# Patient Record
Sex: Female | Born: 1965 | Race: Black or African American | Hispanic: No | Marital: Married | Smoking: Never smoker
Health system: Southern US, Community
[De-identification: ages and names within clinical notes are randomized; demographics above are authoritative.]

## PROBLEM LIST (undated history)

## (undated) DIAGNOSIS — E785 Hyperlipidemia, unspecified: Secondary | ICD-10-CM

## (undated) DIAGNOSIS — Z5189 Encounter for other specified aftercare: Secondary | ICD-10-CM

## (undated) DIAGNOSIS — J309 Allergic rhinitis, unspecified: Secondary | ICD-10-CM

## (undated) DIAGNOSIS — H1045 Other chronic allergic conjunctivitis: Secondary | ICD-10-CM

## (undated) DIAGNOSIS — Z9289 Personal history of other medical treatment: Secondary | ICD-10-CM

## (undated) DIAGNOSIS — F329 Major depressive disorder, single episode, unspecified: Secondary | ICD-10-CM

## (undated) DIAGNOSIS — G709 Myoneural disorder, unspecified: Secondary | ICD-10-CM

## (undated) DIAGNOSIS — D649 Anemia, unspecified: Secondary | ICD-10-CM

## (undated) DIAGNOSIS — IMO0001 Reserved for inherently not codable concepts without codable children: Secondary | ICD-10-CM

## (undated) DIAGNOSIS — F32A Depression, unspecified: Secondary | ICD-10-CM

## (undated) HISTORY — DX: Major depressive disorder, single episode, unspecified: F32.9

## (undated) HISTORY — DX: Depression, unspecified: F32.A

## (undated) HISTORY — DX: Hyperlipidemia, unspecified: E78.5

## (undated) HISTORY — PX: BLADDER SURGERY: SHX569

## (undated) HISTORY — DX: Other chronic allergic conjunctivitis: H10.45

## (undated) HISTORY — DX: Allergic rhinitis, unspecified: J30.9

## (undated) HISTORY — DX: Reserved for inherently not codable concepts without codable children: IMO0001

## (undated) HISTORY — DX: Encounter for other specified aftercare: Z51.89

## (undated) HISTORY — DX: Anemia, unspecified: D64.9

---

## 1999-09-07 ENCOUNTER — Encounter: Admission: RE | Admit: 1999-09-07 | Discharge: 1999-09-07 | Payer: Self-pay | Admitting: Family Medicine

## 1999-09-07 ENCOUNTER — Encounter: Payer: Self-pay | Admitting: Family Medicine

## 1999-11-28 ENCOUNTER — Other Ambulatory Visit: Admission: RE | Admit: 1999-11-28 | Discharge: 1999-11-28 | Payer: Self-pay | Admitting: Obstetrics and Gynecology

## 1999-12-07 ENCOUNTER — Encounter: Admission: RE | Admit: 1999-12-07 | Discharge: 1999-12-07 | Payer: Self-pay | Admitting: Obstetrics and Gynecology

## 1999-12-07 ENCOUNTER — Encounter: Payer: Self-pay | Admitting: Obstetrics and Gynecology

## 2000-08-17 ENCOUNTER — Encounter: Payer: Self-pay | Admitting: Obstetrics and Gynecology

## 2000-08-17 ENCOUNTER — Encounter: Admission: RE | Admit: 2000-08-17 | Discharge: 2000-08-17 | Payer: Self-pay | Admitting: Obstetrics and Gynecology

## 2000-11-13 ENCOUNTER — Emergency Department (HOSPITAL_COMMUNITY): Admission: EM | Admit: 2000-11-13 | Discharge: 2000-11-13 | Payer: Self-pay

## 2000-11-13 ENCOUNTER — Encounter: Payer: Self-pay | Admitting: Internal Medicine

## 2001-04-02 ENCOUNTER — Encounter: Payer: Self-pay | Admitting: Family Medicine

## 2001-04-02 ENCOUNTER — Encounter: Admission: RE | Admit: 2001-04-02 | Discharge: 2001-04-02 | Payer: Self-pay | Admitting: Family Medicine

## 2001-04-18 ENCOUNTER — Encounter: Admission: RE | Admit: 2001-04-18 | Discharge: 2001-07-17 | Payer: Self-pay | Admitting: Family Medicine

## 2001-09-24 ENCOUNTER — Ambulatory Visit (HOSPITAL_COMMUNITY): Admission: RE | Admit: 2001-09-24 | Discharge: 2001-09-24 | Payer: Self-pay | Admitting: Family Medicine

## 2001-12-31 ENCOUNTER — Other Ambulatory Visit: Admission: RE | Admit: 2001-12-31 | Discharge: 2001-12-31 | Payer: Self-pay | Admitting: Obstetrics and Gynecology

## 2002-01-02 ENCOUNTER — Encounter: Payer: Self-pay | Admitting: Obstetrics and Gynecology

## 2002-01-02 ENCOUNTER — Encounter: Admission: RE | Admit: 2002-01-02 | Discharge: 2002-01-02 | Payer: Self-pay | Admitting: Obstetrics and Gynecology

## 2002-01-27 ENCOUNTER — Observation Stay (HOSPITAL_COMMUNITY): Admission: RE | Admit: 2002-01-27 | Discharge: 2002-01-28 | Payer: Self-pay | Admitting: Obstetrics and Gynecology

## 2002-01-27 ENCOUNTER — Encounter (INDEPENDENT_AMBULATORY_CARE_PROVIDER_SITE_OTHER): Payer: Self-pay | Admitting: Specialist

## 2004-04-12 ENCOUNTER — Encounter: Admission: RE | Admit: 2004-04-12 | Discharge: 2004-04-12 | Payer: Self-pay | Admitting: Family Medicine

## 2005-02-27 HISTORY — PX: SHOULDER SURGERY: SHX246

## 2005-03-20 ENCOUNTER — Encounter: Admission: RE | Admit: 2005-03-20 | Discharge: 2005-03-20 | Payer: Self-pay | Admitting: Family Medicine

## 2005-04-01 ENCOUNTER — Emergency Department (HOSPITAL_COMMUNITY): Admission: EM | Admit: 2005-04-01 | Discharge: 2005-04-01 | Payer: Self-pay | Admitting: Emergency Medicine

## 2007-05-08 ENCOUNTER — Encounter: Admission: RE | Admit: 2007-05-08 | Discharge: 2007-05-08 | Payer: Self-pay | Admitting: Family Medicine

## 2008-02-19 ENCOUNTER — Ambulatory Visit (HOSPITAL_COMMUNITY): Admission: RE | Admit: 2008-02-19 | Discharge: 2008-02-20 | Payer: Self-pay | Admitting: Orthopedic Surgery

## 2008-05-07 ENCOUNTER — Emergency Department (HOSPITAL_COMMUNITY): Admission: EM | Admit: 2008-05-07 | Discharge: 2008-05-07 | Payer: Self-pay | Admitting: Emergency Medicine

## 2008-08-28 ENCOUNTER — Encounter: Admission: RE | Admit: 2008-08-28 | Discharge: 2008-08-28 | Payer: Self-pay | Admitting: Orthopedic Surgery

## 2010-03-16 ENCOUNTER — Emergency Department (HOSPITAL_COMMUNITY)
Admission: EM | Admit: 2010-03-16 | Discharge: 2010-03-16 | Payer: Self-pay | Source: Home / Self Care | Admitting: Emergency Medicine

## 2010-03-20 ENCOUNTER — Encounter: Payer: Self-pay | Admitting: Internal Medicine

## 2010-03-21 LAB — URINALYSIS, ROUTINE W REFLEX MICROSCOPIC
Bilirubin Urine: NEGATIVE
Hgb urine dipstick: NEGATIVE
Specific Gravity, Urine: 1.023 (ref 1.005–1.030)
Urobilinogen, UA: 1 mg/dL (ref 0.0–1.0)
pH: 7.5 (ref 5.0–8.0)

## 2010-03-21 LAB — URINE CULTURE
Colony Count: NO GROWTH
Culture  Setup Time: 201201190108

## 2010-03-21 LAB — GC/CHLAMYDIA PROBE AMP, GENITAL

## 2010-03-21 LAB — WET PREP, GENITAL
Trich, Wet Prep: NONE SEEN
Yeast Wet Prep HPF POC: NONE SEEN

## 2010-07-12 NOTE — Op Note (Signed)
Lisa [name], Lisa [name]              ACCOUNT NO.:  [id number]   MEDICAL RECORD NO.:  [id number]          PATIENT TYPE:  OIB   LOCATION:  5002                         FACILITY:  [facility]   PHYSICIAN:  Alvy Beal, MD    DATE OF BIRTH:  [birth year]   DATE OF PROCEDURE:  DATE OF DISCHARGE:                               OPERATIVE REPORT   PREOPERATIVE DIAGNOSIS:  Left C5 radiculopathy from a posterior disk  herniation C4-5.   POSTOPERATIVE DIAGNOSIS:  Left C5 radiculopathy from a posterior disk  herniation C4-5.   OPERATIVE PROCEDURE:  Anterior cervical diskectomy and fusion C4-5 with  a 7-mm lordotic precut MTE left fibular allograft bone with a K2M  Pyrenees anterior cervical plate with 78-GN locking screws.   COMPLICATIONS:  None.   CONDITION:  Stable.   HISTORY:  Lisa [name] is a pleasant 45 year old woman who has been  complaining of severe neck and left arm pain for the last few months.  Attempts at conservative management have failed to alleviate her  symptoms.  Her clinical symptoms and MRIs confirmed the central disk  herniation.  Central disk herniation slightly worse to the left with  left C5 radicular pain.  After discussing all appropriate risks,  benefits and alternatives to surgery, the patient consented to the  aforementioned procedure.   OPERATIVE NOTE:  The patient was brought to the operating room, placed  supine on the operating table.  After successful induction of general  anesthesia and endotracheal intubation, TEDs and SCDs were applied.  Rolled towels were placed behind the neck between the shoulder blades  and the arms were taped down.  The anterior cervical spine was prepped  and draped in the usual fashion.   A left-sided incision was made just at the level of the thyroid  cartilage.  Sharp dissection was carried out down through the platysma.  I then bluntly dissected through the deep cervical fascia sweeping the  esophagus and trachea medially with my  finger until I could palpate the  anterior cervical spine.  I then visualized and protect the carotid  sheath for the on the lateral side.  I then placed appendiceal retractor  to keep the trachea and esophagus medially retracted and began removing  the prevertebral fascia to expose the anterior longitudinal ligament.  An 18-gauge needle was then placed into the C4-5 disk space and an x-ray  was taken to confirm that I was at the appropriate level.   Once confirmed, I then proceed with the diskectomy.  I first mobilized  the anterior longitudinal ligament and mobilized the longus coli muscles  laterally to the uncovertebral joints.  I placed a channel line Caspar  retracting blades into the wound, deflated the endotracheal cuff,  expanded the retractors and then reinflated the endotracheal cuff.  I  then placed distraction pins into the bodies of C5 and C4 and distracted  the interbody space.   A 15 blade scalpel was used to incise the annulus.  Using a combination  of pituitary rongeurs, curettes, and Kerrison rongeurs, I resected the  disk material.  Once I was down to  the posterior annulus, I noticed  there was a small rent and I removed the fragmented disk material from  the posterior from aspect of the vertebral body.  I then used a micro  nerve hook to develop a plane between the posterior longitudinal  ligament and the anterior thecal sac.  I then used a 1-mm Kerrison to  resect the posterior longitudinal ligament in its entirety.  This  allowed me to sweep behind the vertebral bodies ensuring that there was  no free fragments of disk material.  I was also able to resect some of  the bone spurs from the uncovertebral joint.  I then irrigated copiously  with normal saline and then rasped the endplates, so I had a nice  bleeding subchondral bone.  I then measured the interbody space and then  packed it with a VAC via 7-mm lordotic precut graft with Actifuse packed  in the center.   I then took a 20-mm K2M Pyrenees plate secured it to the  anterior aspect of the vertebral body and took an x-ray to confirmed it  was a satisfactory length.  Once confirmed, I used an awl to punch  through and placed 40-mm self-drilling locking screws through the plate  and into the vertebral body.  I made sure they were all torqued  appropriately to their final torque.  I then removed the remaining  retractors and then swept the esophagus to ensure that it did not become  entrapped underneath the plate inadvertently.  Once I knew the esophagus  was free, I then returned the esophagus trachea to midline, irrigated  the wound copiously with normal saline, closed the platysma with  interrupted 2-0 Vicryl sutures and the skin with a 3-0 Monocryl.  Steri-  Strips and dry dressing were applied and the patient tolerated the  procedure well.   X-rays will be taken in the PACU to confirm satisfactory position of the  hardware and will begin mobilization, most likely the patient will be  discharged to home tomorrow with appropriate followup.  At the end of  the case, the all needle and sponge counts were correct.   FIRST ASSISTANT:  Crissie Reese, PA      Alvy Beal, MD  Electronically Signed     DDB/MEDQ  D:  02/19/2008  T:  02/19/2008  Job:  [id number]

## 2010-07-15 NOTE — Op Note (Signed)
NAMEMARYLEN, Lisa [name]                        ACCOUNT NO.:  [id number]   MEDICAL RECORD NO.:  [id number]                   PATIENT TYPE:  OBV   LOCATION:  9399                                 FACILITY:  [facility]   PHYSICIAN:  Cynthia P. Romine, M.D.             DATE OF BIRTH:  [birth year]   DATE OF PROCEDURE:  01/27/2002  DATE OF DISCHARGE:                                 OPERATIVE REPORT   PREOPERATIVE DIAGNOSES:  Menorrhagia and probable adenomyosis.   POSTOPERATIVE DIAGNOSES:  Menorrhagia and probable adenomyosis, pathology  pending.   PROCEDURE:  Total vaginal hysterectomy.   SURGEON:  Cynthia P. Romine, M.D.   ASSISTANT:  Andres Ege, M.D.   ANESTHESIA:  General endotracheal.   ESTIMATED BLOOD LOSS:  200 cc.   COMPLICATIONS:  None.   DESCRIPTION OF PROCEDURE:  The patient was taken to the operating room and  after the induction of adequate general endotracheal anesthesia is prepped  and draped in the usual fashion and placed in the dorsal lithotomy position.  Posterior weighted and anterior Sims retractor were placed.  The cervix was  grasped on its anterior lip with a single-tooth tenaculum, and an incision  was made over the mucosa of the cervix with a knife.  The mucosa was pushed  anteriorly with sharp and blunt dissection.  A posterior colpotomy incision  was made and the Bonnano retractor was placed into the posterior peritoneal  space.  The uterosacral ligaments were clamped, cut, and doubly tied with 0  chromic.  The cardinal ligaments were then clamped, cut, and doubly tied as  well.  The anterior peritoneum was entered atraumatically and the retractor  placed into the anterior peritoneal space.  The pedicle containing the  uterine artery was clamped, cut, and doubly tied bilaterally.  The procedure  continued up the broad ligament, one more clamp, cut, and tie sequence.  The  fundus was then delivered posteriorly and the pedicle containing the utero-  ovarian ligament, tube, and round ligament was clamped, cut, and doubly tied  on both sides.  The specimen was removed and sent to pathology.  The  pedicles were inspected and felt to be hemostatic.  The posterior vaginal  cuff was run with 2-0 chromic incorporating the posterior cuff and the  posterior peritoneum for hemostasis.  The pedicles were then inspected and  were free of bleeding.  The wound was irrigated with warm saline.  The  vaginal cuff was then closed with interrupted figure-of-eights of 0 chromic,  and the procedure was terminated.  A Foley catheter was inserted and the  procedure was terminated.  The patient tolerated it well and went in  satisfactory condition to postanesthesia recovery.  Sponge, needle, and  instrument counts were correct x3.  Cynthia P. Romine, M.D.    CPR/MEDQ  D:  01/27/2002  T:  01/27/2002  Job:  [id number]

## 2010-12-01 LAB — BASIC METABOLIC PANEL
CO2: 20 mEq/L (ref 19–32)
Chloride: 108 mEq/L (ref 96–112)
Glucose, Bld: 121 mg/dL — ABNORMAL HIGH (ref 70–99)
Sodium: 137 mEq/L (ref 135–145)

## 2010-12-01 LAB — CBC
HCT: 33.4 % — ABNORMAL LOW (ref 36.0–46.0)
HCT: 37.8 % (ref 36.0–46.0)
MCHC: 34 g/dL (ref 30.0–36.0)
MCV: 92.3 fL (ref 78.0–100.0)
MCV: 92.8 fL (ref 78.0–100.0)
Platelets: 242 10*3/uL (ref 150–400)
Platelets: 277 10*3/uL (ref 150–400)
RBC: 3.6 MIL/uL — ABNORMAL LOW (ref 3.87–5.11)
RBC: 4.09 MIL/uL (ref 3.87–5.11)
RDW: 13.7 % (ref 11.5–15.5)
WBC: 12.3 10*3/uL — ABNORMAL HIGH (ref 4.0–10.5)
WBC: 4 10*3/uL (ref 4.0–10.5)

## 2011-06-20 ENCOUNTER — Encounter (INDEPENDENT_AMBULATORY_CARE_PROVIDER_SITE_OTHER): Payer: Self-pay | Admitting: Surgery

## 2011-07-11 ENCOUNTER — Ambulatory Visit (INDEPENDENT_AMBULATORY_CARE_PROVIDER_SITE_OTHER): Payer: Medicare Other | Admitting: Surgery

## 2011-07-11 ENCOUNTER — Encounter (INDEPENDENT_AMBULATORY_CARE_PROVIDER_SITE_OTHER): Payer: Self-pay | Admitting: Surgery

## 2011-07-11 VITALS — BP 102/67 | HR 76 | Temp 97.7°F | Resp 16 | Ht 67.0 in | Wt 232.5 lb

## 2011-07-11 DIAGNOSIS — K429 Umbilical hernia without obstruction or gangrene: Secondary | ICD-10-CM

## 2011-07-11 NOTE — Progress Notes (Signed)
Patient ID: Lisa [name], female   DOB: 16-Nov-1965, 46 y.o.   MRN: [id number]  Chief Complaint  Patient presents with  . Umbilical Hernia    HPI Lisa [name] is a 46 y.o. female.  This is a very pleasant female referred by Dr.Koirala for evaluation of a symptomatic umbilical hernia. She reports she has had a hernia for some time but recently over the past several months it is causing her increasing discomfort which he describes as a moderate headache and stabbing pain. She denies any obstructive symptoms. She has had no nausea or vomiting is otherwise doing well. She does not do any vigorous activity or lifting. The discomfort does not refer anywhere else HPI  Past Medical History  Diagnosis Date  . Depression   . Allergic rhinitis, cause unspecified   . Other chronic allergic conjunctivitis     History reviewed. No pertinent past surgical history.  History reviewed. No pertinent family history.  Social History History  Substance Use Topics  . Smoking status: Never Smoker   . Smokeless tobacco: Not on file  . Alcohol Use: No    No Known Allergies  Current Outpatient Prescriptions  Medication Sig Dispense Refill  . ARIPiprazole (ABILIFY) 2 MG tablet Take 2 mg by mouth daily.      . DULoxetine (CYMBALTA) 60 MG capsule Take 60 mg by mouth daily.      Lisa [name] Kitchen gabapentin (NEURONTIN) 300 MG capsule Take 300 mg by mouth 3 (three) times daily.      Lisa [name] Kitchen HYDROcodone-acetaminophen (VICODIN) 5-500 MG per tablet Take 1 tablet by mouth every 6 (six) hours as needed.      . loratadine (CLARITIN) 10 MG tablet Take 10 mg by mouth daily.      . methocarbamol (ROBAXIN) 750 MG tablet Take 750 mg by mouth as needed.      . Parenteral Therapy Supplies (NUTRI-CLAMP/LIFECARE PARTIAL) MISC by Does not apply route.      Lisa [name] Kitchen zolpidem (AMBIEN) 10 MG tablet Take 10 mg by mouth at bedtime as needed.        Review of Systems Review of Systems  Constitutional: Negative for fever, chills and unexpected  weight change.  HENT: Negative for hearing loss, congestion, sore throat, trouble swallowing and voice change.   Eyes: Negative for visual disturbance.  Respiratory: Negative for cough and wheezing.   Cardiovascular: Negative for chest pain, palpitations and leg swelling.  Gastrointestinal: Positive for abdominal pain. Negative for nausea, vomiting, diarrhea, constipation, blood in stool, abdominal distention and anal bleeding.  Genitourinary: Negative for hematuria, vaginal bleeding and difficulty urinating.  Musculoskeletal: Negative for arthralgias.  Skin: Negative for rash and wound.  Neurological: Negative for seizures, syncope and headaches.  Hematological: Negative for adenopathy. Does not bruise/bleed easily.  Psychiatric/Behavioral: Negative for confusion.    Blood pressure 102/67, pulse 76, temperature 97.7 F (36.5 C), temperature source Temporal, resp. rate 16, height 5\' 7"  (1.702 m), weight 232 lb 8 oz (105.461 kg), SpO2 97.00%.  Physical Exam Physical Exam  Constitutional: She is oriented to person, place, and time. She appears well-developed and well-nourished. No distress.  HENT:  Head: Normocephalic and atraumatic.  Right Ear: External ear normal.  Left Ear: External ear normal.  Nose: Nose normal.  Mouth/Throat: Oropharynx is clear and moist. No oropharyngeal exudate.  Eyes: Conjunctivae are normal. Pupils are equal, round, and reactive to light.  Neck: Normal range of motion. Neck supple. No tracheal deviation present. No thyromegaly present.  Cardiovascular: Normal rate, regular rhythm,  normal heart sounds and intact distal pulses.   No murmur heard. Pulmonary/Chest: Effort normal and breath sounds normal. No respiratory distress. She has no wheezes.  Abdominal: Soft. Bowel sounds are normal. She exhibits no distension. There is tenderness. There is no rebound and no guarding.       There is an easily reducible hernia at the umbilicus which is mildly tender    Musculoskeletal: Normal range of motion. She exhibits no edema and no tenderness.  Lymphadenopathy:    She has no cervical adenopathy.  Neurological: She is alert and oriented to person, place, and time.  Skin: Skin is warm and dry. No erythema. No pallor.  Psychiatric: Her behavior is normal. Judgment normal.    Data Reviewed I have reviewed the notes from her primary care physician  Assessment    Umbilical hernia    Plan    Repair is recommended. I discussed this with her in detail. She is symptomatic. I discussed the risks of surgery in detail. I also discussed the use of mesh. The risks of surgery include but not limited to bleeding, infection, recurrence, injury, et Karie Soda. She understands and wishes to proceed. I discussed postoperative followup. Likely of success is good       Sunya Humbarger A 07/11/2011, 10:59 AM

## 2011-08-17 ENCOUNTER — Encounter (HOSPITAL_COMMUNITY): Payer: Self-pay | Admitting: Pharmacy Technician

## 2011-08-18 ENCOUNTER — Encounter (HOSPITAL_COMMUNITY): Payer: Self-pay

## 2011-08-18 ENCOUNTER — Encounter (HOSPITAL_COMMUNITY)
Admission: RE | Admit: 2011-08-18 | Discharge: 2011-08-18 | Disposition: A | Discharge status: Home / Self Care | Payer: Medicare Other | Source: Ambulatory Visit | Attending: Surgery | Admitting: Surgery

## 2011-08-18 DIAGNOSIS — D649 Anemia, unspecified: Secondary | ICD-10-CM

## 2011-08-18 DIAGNOSIS — G709 Myoneural disorder, unspecified: Secondary | ICD-10-CM

## 2011-08-18 DIAGNOSIS — Z9289 Personal history of other medical treatment: Secondary | ICD-10-CM

## 2011-08-18 HISTORY — PX: HAND SURGERY: SHX662

## 2011-08-18 HISTORY — DX: Myoneural disorder, unspecified: G70.9

## 2011-08-18 HISTORY — PX: ABDOMINAL HYSTERECTOMY: SHX81

## 2011-08-18 HISTORY — DX: Anemia, unspecified: D64.9

## 2011-08-18 HISTORY — DX: Personal history of other medical treatment: Z92.89

## 2011-08-18 HISTORY — PX: NECK SURGERY: SHX720

## 2011-08-18 LAB — CBC
MCH: 29.1 pg (ref 26.0–34.0)
MCHC: 31.6 g/dL (ref 30.0–36.0)
Platelets: 325 10*3/uL (ref 150–400)
RDW: 14.4 % (ref 11.5–15.5)

## 2011-08-18 LAB — SURGICAL PCR SCREEN
MRSA, PCR: NEGATIVE
Staphylococcus aureus: POSITIVE — AB

## 2011-08-18 NOTE — Patient Instructions (Addendum)
20 Lisa [name]  08/18/2011   Your procedure is scheduled on:  6-28 -2013  Report to [facility] at   0700     AM.  Call this number if you have problems the morning of surgery: [phone]   Remember:   Do not eat food:After Midnight.    Take these medicines the morning of surgery with A SIP OF WATER: Abilify, Gabapentin, Loratadine   Do not wear jewelry, make-up or nail polish.  Do not wear lotions, powders, or perfumes. You may wear deodorant.  Do not shave 48 hours prior to surgery.(face and neck okay, no shaving of legs)  Do not bring valuables to the hospital.  Contacts, dentures or bridgework may not be worn into surgery.  Leave suitcase in the car. After surgery it may be brought to your room.  For patients admitted to the hospital, checkout time is 11:00 AM the day of discharge.   Patients discharged the day of surgery will not be allowed to drive home.  Name and phone number of your driver: spouse  Special Instructions: CHG Shower Use Special Wash: 1/2 bottle night before surgery and 1/2 bottle morning of surgery.(avoid face and genitals)   Please read over the following fact sheets that you were given: MRSA Information.

## 2011-08-18 NOTE — Pre-Procedure Instructions (Addendum)
08-18-11. No EKG/CXR required per guidelines. Teach back method used for preop instructions. 08-18-11 1640 Pt made aware of positive PCR screen for Staph aureus-will Use Mupirocin as directed. W. Kennon Portela

## 2011-08-24 NOTE — H&P (Signed)
Patient ID: Lisa [name], female DOB: 1965-06-23, 46 y.o. MRN: [id number]  Chief Complaint   Patient presents with   .  Umbilical Hernia    HPI  Lisa [name] is a 46 y.o. female. This is a very pleasant female referred by Dr.Koirala for evaluation of a symptomatic umbilical hernia. She reports she has had a hernia for some time but recently over the past several months it is causing her increasing discomfort which he describes as a moderate headache and stabbing pain. She denies any obstructive symptoms. She has had no nausea or vomiting is otherwise doing well. She does not do any vigorous activity or lifting. The discomfort does not refer anywhere else  HPI  Past Medical History   Diagnosis  Date   .  Depression    .  Allergic rhinitis, cause unspecified    .  Other chronic allergic conjunctivitis     History reviewed. No pertinent past surgical history.  History reviewed. No pertinent family history.  Social History  History   Substance Use Topics   .  Smoking status:  Never Smoker   .  Smokeless tobacco:  Not on file   .  Alcohol Use:  No    No Known Allergies  Current Outpatient Prescriptions   Medication  Sig  Dispense  Refill   .  ARIPiprazole (ABILIFY) 2 MG tablet  Take 2 mg by mouth daily.     .  DULoxetine (CYMBALTA) 60 MG capsule  Take 60 mg by mouth daily.     Marland Kitchen  gabapentin (NEURONTIN) 300 MG capsule  Take 300 mg by mouth 3 (three) times daily.     Marland Kitchen  HYDROcodone-acetaminophen (VICODIN) 5-500 MG per tablet  Take 1 tablet by mouth every 6 (six) hours as needed.     .  loratadine (CLARITIN) 10 MG tablet  Take 10 mg by mouth daily.     .  methocarbamol (ROBAXIN) 750 MG tablet  Take 750 mg by mouth as needed.     .  Parenteral Therapy Supplies (NUTRI-CLAMP/LIFECARE PARTIAL) MISC  by Does not apply route.     Marland Kitchen  zolpidem (AMBIEN) 10 MG tablet  Take 10 mg by mouth at bedtime as needed.      Review of Systems  Review of Systems  Constitutional: Negative for fever,  chills and unexpected weight change.  HENT: Negative for hearing loss, congestion, sore throat, trouble swallowing and voice change.  Eyes: Negative for visual disturbance.  Respiratory: Negative for cough and wheezing.  Cardiovascular: Negative for chest pain, palpitations and leg swelling.  Gastrointestinal: Positive for abdominal pain. Negative for nausea, vomiting, diarrhea, constipation, blood in stool, abdominal distention and anal bleeding.  Genitourinary: Negative for hematuria, vaginal bleeding and difficulty urinating.  Musculoskeletal: Negative for arthralgias.  Skin: Negative for rash and wound.  Neurological: Negative for seizures, syncope and headaches.  Hematological: Negative for adenopathy. Does not bruise/bleed easily.  Psychiatric/Behavioral: Negative for confusion.   Blood pressure 102/67, pulse 76, temperature 97.7 F (36.5 C), temperature source Temporal, resp. rate 16, height 5\' 7"  (1.702 m), weight 232 lb 8 oz (105.461 kg), SpO2 97.00%.  Physical Exam  Physical Exam  Constitutional: She is oriented to person, place, and time. She appears well-developed and well-nourished. No distress.  HENT:  Head: Normocephalic and atraumatic.  Right Ear: External ear normal.  Left Ear: External ear normal.  Nose: Nose normal.  Mouth/Throat: Oropharynx is clear and moist. No oropharyngeal exudate.  Eyes: Conjunctivae are normal.  Pupils are equal, round, and reactive to light.  Neck: Normal range of motion. Neck supple. No tracheal deviation present. No thyromegaly present.  Cardiovascular: Normal rate, regular rhythm, normal heart sounds and intact distal pulses.  No murmur heard.  Pulmonary/Chest: Effort normal and breath sounds normal. No respiratory distress. She has no wheezes.  Abdominal: Soft. Bowel sounds are normal. She exhibits no distension. There is tenderness. There is no rebound and no guarding.  There is an easily reducible hernia at the umbilicus which is mildly  tender  Musculoskeletal: Normal range of motion. She exhibits no edema and no tenderness.  Lymphadenopathy:  She has no cervical adenopathy.  Neurological: She is alert and oriented to person, place, and time.  Skin: Skin is warm and dry. No erythema. No pallor.  Psychiatric: Her behavior is normal. Judgment normal.   Data Reviewed  I have reviewed the notes from her primary care physician  Assessment   Umbilical hernia   Plan   Repair is recommended. I discussed this with her in detail. She is symptomatic. I discussed the risks of surgery in detail. I also discussed the use of mesh. The risks of surgery include but not limited to bleeding, infection, recurrence, injury, et Karie Soda. She understands and wishes to proceed. I discussed postoperative followup. Likely of success is good   Dim Meisinger A

## 2011-08-25 ENCOUNTER — Encounter (HOSPITAL_COMMUNITY)
Admission: RE | Disposition: A | Discharge status: Home / Self Care | Payer: Self-pay | Source: Ambulatory Visit | Attending: Surgery

## 2011-08-25 ENCOUNTER — Ambulatory Visit (HOSPITAL_COMMUNITY): Payer: Medicare Other | Admitting: Anesthesiology

## 2011-08-25 ENCOUNTER — Ambulatory Visit (HOSPITAL_COMMUNITY)
Admission: RE | Admit: 2011-08-25 | Discharge: 2011-08-25 | Disposition: A | Discharge status: Home / Self Care | Payer: Medicare Other | Source: Ambulatory Visit | Attending: Surgery | Admitting: Surgery

## 2011-08-25 ENCOUNTER — Encounter (HOSPITAL_COMMUNITY): Payer: Self-pay | Admitting: *Deleted

## 2011-08-25 ENCOUNTER — Encounter (HOSPITAL_COMMUNITY): Payer: Self-pay | Admitting: Anesthesiology

## 2011-08-25 DIAGNOSIS — Z79899 Other long term (current) drug therapy: Secondary | ICD-10-CM | POA: Insufficient documentation

## 2011-08-25 DIAGNOSIS — K429 Umbilical hernia without obstruction or gangrene: Secondary | ICD-10-CM

## 2011-08-25 DIAGNOSIS — Z01812 Encounter for preprocedural laboratory examination: Secondary | ICD-10-CM | POA: Insufficient documentation

## 2011-08-25 HISTORY — PX: UMBILICAL HERNIA REPAIR: SHX196

## 2011-08-25 SURGERY — REPAIR, HERNIA, UMBILICAL, ADULT
Anesthesia: General | Wound class: Clean

## 2011-08-25 MED ORDER — ACETAMINOPHEN 325 MG PO TABS: 650.0000 mg | ORAL_TABLET | ORAL | Status: DC | PRN

## 2011-08-25 MED ORDER — OXYCODONE HCL 5 MG PO TABS
5.0000 mg | ORAL_TABLET | ORAL | Status: DC | PRN
  Administered 2011-08-25 (×2): 5 mg via ORAL

## 2011-08-25 MED ORDER — ACETAMINOPHEN 650 MG RE SUPP
650.0000 mg | RECTAL | Status: DC | PRN
  Filled 2011-08-25: qty 1

## 2011-08-25 MED ORDER — CEFAZOLIN SODIUM-DEXTROSE 2-3 GM-% IV SOLR
2.0000 g | Freq: Once | INTRAVENOUS | Status: AC
  Administered 2011-08-25: 2 g via INTRAVENOUS

## 2011-08-25 MED ORDER — OXYCODONE HCL 5 MG PO TABS
ORAL_TABLET | ORAL | Status: AC
  Filled 2011-08-25: qty 1

## 2011-08-25 MED ORDER — SODIUM CHLORIDE 0.9 % IV SOLN: 250.0000 mL | INTRAVENOUS | Status: DC | PRN

## 2011-08-25 MED ORDER — SODIUM CHLORIDE 0.9 % IJ SOLN: 3.0000 mL | INTRAMUSCULAR | Status: DC | PRN

## 2011-08-25 MED ORDER — LACTATED RINGERS IV SOLN
INTRAVENOUS | Status: DC
  Administered 2011-08-25: 11:00:00 via INTRAVENOUS
  Administered 2011-08-25: 1000 mL via INTRAVENOUS

## 2011-08-25 MED ORDER — HYDROCODONE-ACETAMINOPHEN 5-325 MG PO TABS: 1.0000 | ORAL_TABLET | ORAL | Status: AC | PRN

## 2011-08-25 MED ORDER — 0.9 % SODIUM CHLORIDE (POUR BTL) OPTIME
TOPICAL | Status: DC | PRN
  Administered 2011-08-25: 1000 mL

## 2011-08-25 MED ORDER — CEFAZOLIN SODIUM-DEXTROSE 2-3 GM-% IV SOLR
INTRAVENOUS | Status: AC
  Filled 2011-08-25: qty 50

## 2011-08-25 MED ORDER — HYDROMORPHONE HCL PF 1 MG/ML IJ SOLN
INTRAMUSCULAR | Status: AC
  Filled 2011-08-25: qty 1

## 2011-08-25 MED ORDER — FENTANYL CITRATE 0.05 MG/ML IJ SOLN
INTRAMUSCULAR | Status: DC | PRN
  Administered 2011-08-25 (×2): 100 ug via INTRAVENOUS

## 2011-08-25 MED ORDER — ACETAMINOPHEN 10 MG/ML IV SOLN
1000.0000 mg | Freq: Once | INTRAVENOUS | Status: AC
  Administered 2011-08-25: 1000 mg via INTRAVENOUS

## 2011-08-25 MED ORDER — GLYCOPYRROLATE 0.2 MG/ML IJ SOLN
INTRAMUSCULAR | Status: DC | PRN
  Administered 2011-08-25: .8 mg via INTRAVENOUS

## 2011-08-25 MED ORDER — BUPIVACAINE HCL (PF) 0.5 % IJ SOLN
INTRAMUSCULAR | Status: AC
  Filled 2011-08-25: qty 30

## 2011-08-25 MED ORDER — PROMETHAZINE HCL 25 MG/ML IJ SOLN: 6.2500 mg | INTRAMUSCULAR | Status: DC | PRN

## 2011-08-25 MED ORDER — LIDOCAINE HCL (CARDIAC) 20 MG/ML IV SOLN
INTRAVENOUS | Status: DC | PRN
  Administered 2011-08-25: 50 mg via INTRAVENOUS

## 2011-08-25 MED ORDER — ONDANSETRON HCL 4 MG/2ML IJ SOLN
INTRAMUSCULAR | Status: DC | PRN
  Administered 2011-08-25: 4 mg via INTRAVENOUS

## 2011-08-25 MED ORDER — PROPOFOL 10 MG/ML IV BOLUS
INTRAVENOUS | Status: DC | PRN
  Administered 2011-08-25: 180 mg via INTRAVENOUS

## 2011-08-25 MED ORDER — HYDROMORPHONE HCL PF 1 MG/ML IJ SOLN
0.2500 mg | INTRAMUSCULAR | Status: DC | PRN
  Administered 2011-08-25 (×4): 0.5 mg via INTRAVENOUS

## 2011-08-25 MED ORDER — ROCURONIUM BROMIDE 100 MG/10ML IV SOLN
INTRAVENOUS | Status: DC | PRN
  Administered 2011-08-25: 40 mg via INTRAVENOUS

## 2011-08-25 MED ORDER — SODIUM CHLORIDE 0.9 % IJ SOLN: 3.0000 mL | Freq: Two times a day (BID) | INTRAMUSCULAR | Status: DC

## 2011-08-25 MED ORDER — MIDAZOLAM HCL 5 MG/5ML IJ SOLN
INTRAMUSCULAR | Status: DC | PRN
  Administered 2011-08-25: 2 mg via INTRAVENOUS

## 2011-08-25 MED ORDER — BUPIVACAINE HCL (PF) 0.5 % IJ SOLN
INTRAMUSCULAR | Status: DC | PRN
  Administered 2011-08-25: 20 mL

## 2011-08-25 MED ORDER — ACETAMINOPHEN 10 MG/ML IV SOLN
INTRAVENOUS | Status: AC
  Filled 2011-08-25: qty 100

## 2011-08-25 MED ORDER — NEOSTIGMINE METHYLSULFATE 1 MG/ML IJ SOLN
INTRAMUSCULAR | Status: DC | PRN
  Administered 2011-08-25: 5 mg via INTRAVENOUS

## 2011-08-25 MED ORDER — ONDANSETRON HCL 4 MG/2ML IJ SOLN: 4.0000 mg | Freq: Four times a day (QID) | INTRAMUSCULAR | Status: DC | PRN

## 2011-08-25 MED ORDER — MORPHINE SULFATE 10 MG/ML IJ SOLN: 2.0000 mg | INTRAMUSCULAR | Status: DC | PRN

## 2011-08-25 SURGICAL SUPPLY — 39 items
APL SKNCLS STERI-STRIP NONHPOA (GAUZE/BANDAGES/DRESSINGS) ×1
BENZOIN TINCTURE PRP APPL 2/3 (GAUZE/BANDAGES/DRESSINGS) ×1 IMPLANT
BINDER ABD UNIV 12 45-62 (WOUND CARE) IMPLANT
BINDER ABDOMINAL 46IN 62IN (WOUND CARE)
BLADE EXTENDED COATED 6.5IN (ELECTRODE) IMPLANT
BLADE HEX COATED 2.75 (ELECTRODE) ×2 IMPLANT
CANISTER SUCTION 2500CC (MISCELLANEOUS) ×2 IMPLANT
CLOTH BEACON ORANGE TIMEOUT ST (SAFETY) ×2 IMPLANT
DECANTER SPIKE VIAL GLASS SM (MISCELLANEOUS) ×1 IMPLANT
DRAPE LAPAROSCOPIC ABDOMINAL (DRAPES) ×2 IMPLANT
DRAPE UTILITY XL STRL (DRAPES) ×1 IMPLANT
DRSG TEGADERM 4X4.75 (GAUZE/BANDAGES/DRESSINGS) ×1 IMPLANT
ELECT REM PT RETURN 9FT ADLT (ELECTROSURGICAL) ×2
ELECTRODE REM PT RTRN 9FT ADLT (ELECTROSURGICAL) ×1 IMPLANT
GAUZE SPONGE 2X2 8PLY STRL LF (GAUZE/BANDAGES/DRESSINGS) IMPLANT
GLOVE BIOGEL PI IND STRL 7.0 (GLOVE) ×1 IMPLANT
GLOVE BIOGEL PI INDICATOR 7.0 (GLOVE) ×1
GLOVE SURG SIGNA 7.5 PF LTX (GLOVE) ×4 IMPLANT
GOWN STRL NON-REIN LRG LVL3 (GOWN DISPOSABLE) ×2 IMPLANT
GOWN STRL REIN XL XLG (GOWN DISPOSABLE) ×4 IMPLANT
KIT BASIN OR (CUSTOM PROCEDURE TRAY) ×2 IMPLANT
MESH VENTRALEX ST 1-7/10 CRC S (Mesh General) ×1 IMPLANT
NEEDLE HYPO 22GX1.5 SAFETY (NEEDLE) IMPLANT
NS IRRIG 1000ML POUR BTL (IV SOLUTION) ×2 IMPLANT
PACK GENERAL/GYN (CUSTOM PROCEDURE TRAY) ×2 IMPLANT
SPONGE GAUZE 2X2 STER 10/PKG (GAUZE/BANDAGES/DRESSINGS) ×1
SPONGE GAUZE 4X4 12PLY (GAUZE/BANDAGES/DRESSINGS) ×1 IMPLANT
STAPLER VISISTAT 35W (STAPLE) IMPLANT
STRIP CLOSURE SKIN 1/2X4 (GAUZE/BANDAGES/DRESSINGS) ×1 IMPLANT
SUT MNCRL AB 4-0 PS2 18 (SUTURE) ×2 IMPLANT
SUT NOVA NAB DX-16 0-1 5-0 T12 (SUTURE) ×4 IMPLANT
SUT VIC AB 3-0 SH 27 (SUTURE) ×2
SUT VIC AB 3-0 SH 27X BRD (SUTURE) ×1 IMPLANT
SUT VICRYL 2 0 18  UND BR (SUTURE)
SUT VICRYL 2 0 18 UND BR (SUTURE) IMPLANT
SYR CONTROL 10ML LL (SYRINGE) IMPLANT
TOWEL OR 17X26 10 PK STRL BLUE (TOWEL DISPOSABLE) ×2 IMPLANT
TOWEL OR NON WOVEN STRL DISP B (DISPOSABLE) ×1 IMPLANT
TRAY FOLEY CATH 14FRSI W/METER (CATHETERS) IMPLANT

## 2011-08-25 NOTE — Interval H&P Note (Signed)
History and Physical Interval Note:  No change in History or Exam  08/25/2011 9:14 AM  Lisa [name]  has presented today for surgery, with the diagnosis of umbilical hernia   The various methods of treatment have been discussed with the patient and family. After consideration of risks, benefits and other options for treatment, the patient has consented to  Procedure(s) (LRB): HERNIA REPAIR UMBILICAL ADULT (N/A) INSERTION OF MESH (N/A) as a surgical intervention .  The patient's history has been reviewed, patient examined, no change in status, stable for surgery.  I have reviewed the patients' chart and labs.  Questions were answered to the patient's satisfaction.     [name] A

## 2011-08-25 NOTE — Discharge Instructions (Signed)
[facility] _______[facility]  UMBILICAL OR INGUINAL HERNIA REPAIR: POST OP INSTRUCTIONS  Always review your discharge instruction sheet given to you by the facility where your surgery was performed. IF YOU HAVE DISABILITY OR FAMILY LEAVE FORMS, YOU MUST BRING THEM TO THE OFFICE FOR PROCESSING.   DO NOT GIVE THEM TO YOUR DOCTOR.  1. A  prescription for pain medication may be given to you upon discharge.  Take your pain medication as prescribed, if needed.  If narcotic pain medicine is not needed, then you may take acetaminophen (Tylenol) or ibuprofen (Advil) as needed. 2. Take your usually prescribed medications unless otherwise directed. 3. If you need a refill on your pain medication, please contact your pharmacy.  They will contact our office to request authorization. Prescriptions will not be filled after 5 pm or on week-ends. 4. You should follow a light diet the first 24 hours after arrival home, such as soup and crackers, etc.  Be sure to include lots of fluids daily.  Resume your normal diet the day after surgery. 5. Most patients will experience some swelling and bruising around the umbilicus or in the groin and scrotum.  Ice packs and reclining will help.  Swelling and bruising can take several days to resolve.  6. It is common to experience some constipation if taking pain medication after surgery.  Increasing fluid intake and taking a stool softener (such as Colace) will usually help or prevent this problem from occurring.  A mild laxative (Milk of Magnesia or Miralax) should be taken according to package directions if there are no bowel movements after 48 hours. 7. Unless discharge instructions indicate otherwise, you may remove your bandages 24-48 hours after surgery, and you may shower at that time.  You may have steri-strips (small skin tapes) in place directly over the incision.  These strips should be left on the skin for 7-10 days.  If your surgeon used skin glue on the  incision, you may shower in 24 hours.  The glue will flake off over the next 2-3 weeks.  Any sutures or staples will be removed at the office during your follow-up visit. 8. ACTIVITIES:  You may resume regular (light) daily activities beginning the next day--such as daily self-care, walking, climbing stairs--gradually increasing activities as tolerated.  You may have sexual intercourse when it is comfortable.  Refrain from any heavy lifting or straining until approved by your doctor. a. You may drive when you are no longer taking prescription pain medication, you can comfortably wear a seatbelt, and you can safely maneuver your car and apply brakes. b. RETURN TO WORK:  __________________________________________________________ 9. You should see your doctor in the office for a follow-up appointment approximately 2-3 weeks after your surgery.  Make sure that you call for this appointment within a day or two after you arrive home to insure a convenient appointment time. 10. OTHER INSTRUCTIONS: NO LIFTING MORE THAN 20 POUNDS FOR 4 WEEKS 11. ICE PACK AND IBUPROFEN ALSO FOR PAIN __________________________________________________________________________________________________________________________________________________________________________________________  WHEN TO CALL YOUR DOCTOR: 1. Fever over 101.0 2. Inability to urinate 3. Nausea and/or vomiting 4. Extreme swelling or bruising 5. Continued bleeding from incision. 6. Increased pain, redness, or drainage from the incision  The clinic staff is available to answer your questions during regular business hours.  Please don't hesitate to call and ask to speak to one of the nurses for clinical concerns.  If you have a medical emergency, go to the nearest emergency room or call 911.  A surgeon from [facility] is always on call at the hospital   [address], [address], [place], [place]  [address] ?  [address] [address], [place], [place]    [address] [phone] ? [phone] ? FAX [phone] Web site: [email or web address]

## 2011-08-25 NOTE — Anesthesia Preprocedure Evaluation (Addendum)
Anesthesia Evaluation  Patient identified by MRN, date of birth, ID band Patient awake    Reviewed: Allergy & Precautions, H&P , NPO status , Patient's Chart, lab work & pertinent test results  Airway Mallampati: II TM Distance: >3 FB Neck ROM: Full    Dental No notable dental hx.    Pulmonary neg pulmonary ROS,  breath sounds clear to auscultation  Pulmonary exam normal       Cardiovascular negative cardio ROS  Rhythm:Regular Rate:Normal     Neuro/Psych PSYCHIATRIC DISORDERS Depression  Neuromuscular disease    GI/Hepatic negative GI ROS, Neg liver ROS,   Endo/Other    Renal/GU negative Renal ROS  negative genitourinary   Musculoskeletal negative musculoskeletal ROS (+)   Abdominal   Peds negative pediatric ROS (+)  Hematology negative hematology ROS (+)   Anesthesia Other Findings   Reproductive/Obstetrics negative OB ROS                           Anesthesia Physical Anesthesia Plan  ASA: III  Anesthesia Plan: General   Post-op Pain Management:    Induction: Intravenous  Airway Management Planned: Oral ETT  Additional Equipment:   Intra-op Plan:   Post-operative Plan: Extubation in OR  Informed Consent: I have reviewed the patients History and Physical, chart, labs and discussed the procedure including the risks, benefits and alternatives for the proposed anesthesia with the patient or authorized representative who has indicated his/her understanding and acceptance.   Dental advisory given  Plan Discussed with: CRNA  Anesthesia Plan Comments:         Anesthesia Quick Evaluation

## 2011-08-25 NOTE — Anesthesia Postprocedure Evaluation (Signed)
  Anesthesia Post-op Note  Patient: Lisa [name]  Procedure(s) Performed: Procedure(s) (LRB): HERNIA REPAIR UMBILICAL ADULT (N/A) INSERTION OF MESH (N/A)  Patient Location: PACU  Anesthesia Type: General  Level of Consciousness: awake and alert   Airway and Oxygen Therapy: Patient Spontanous Breathing  Post-op Pain: mild  Post-op Assessment: Post-op Vital signs reviewed, Patient's Cardiovascular Status Stable, Respiratory Function Stable, Patent Airway and No signs of Nausea or vomiting  Post-op Vital Signs: stable  Complications: No apparent anesthesia complications

## 2011-08-25 NOTE — Op Note (Signed)
HERNIA REPAIR UMBILICAL ADULT, INSERTION OF MESH  Procedure Note  Lisa [name] 08/25/2011   Pre-op Diagnosis: umbilical hernia      Post-op Diagnosis: same  Procedure(s): HERNIA REPAIR UMBILICAL ADULT INSERTION OF MESH (4.3 cm round Bard patch)  Surgeon(s): Shelly Rubenstein, MD  Anesthesia: General  Staff:  Vevelyn Pat, RN - Circulator Traci Luciana Axe, CST - Scrub Person Guadelupe Sabin, CST - Scrub Person  Estimated Blood Loss: Minimal               Procedure: The patient was brought to the operating room and identified as the correct patient. She was placed supine on the operating room table and general anesthesia was induced. Her abdomen was then prepped and draped in the usual sterile fashion. I made [name] small semicircular incision at the lower edge of the umbilicus. I took this down to the fascia with the cautery. I separated the umbilical hernia sac from the overlying umbilical skin. The sac was found to contain only omentum which I reduced back into the abdominal cavity. The small sac was excised. I brought [name] 4.3 cm Bard V. Patch onto the field. I placed it to the fascia opening and then pulled up against the peritoneal surface with the ties. I then sutured it in place with several #1 Novafil sutures. I was then able to close the fascia further of the top of the mesh with figure-of-eight #1 Novafil sutures. The closure of the hernia defect appeared to be achieved. I anesthetized the fashion skin with Marcaine. I closed the subcutaneous tissue with interrupted 3-0 Vicryl sutures after imbricating the umbilicus. I then closed the skin with [name] running 4-0 Monocryl. Steri-Strips, gauze, and Tegaderm were then applied. The patient tolerated the procedure well. All the counts were correct at the end of the procedure. The patient was then extubated in the operating room and taken in [name] stable condition to the recovery room.          Lisa [name]   Date: 08/25/2011  Time:  10:24 AM

## 2011-08-25 NOTE — Transfer of Care (Signed)
Immediate Anesthesia Transfer of Care Note  Patient: Lisa [name]  Procedure(s) Performed: Procedure(s) (LRB): HERNIA REPAIR UMBILICAL ADULT (N/A) INSERTION OF MESH (N/A)  Patient Location: PACU  Anesthesia Type: General  Level of Consciousness: oriented, sedated and patient cooperative  Airway & Oxygen Therapy: Patient Spontanous Breathing and Patient connected to face mask oxygen  Post-op Assessment: Report given to PACU RN, Post -op Vital signs reviewed and stable and Patient moving all extremities  Post vital signs: Reviewed and stable  Complications: No apparent anesthesia complications

## 2011-08-28 ENCOUNTER — Encounter (HOSPITAL_COMMUNITY): Payer: Self-pay | Admitting: Surgery

## 2011-09-01 ENCOUNTER — Encounter (INDEPENDENT_AMBULATORY_CARE_PROVIDER_SITE_OTHER): Payer: Medicare Other | Admitting: Surgery

## 2011-09-01 ENCOUNTER — Encounter (INDEPENDENT_AMBULATORY_CARE_PROVIDER_SITE_OTHER): Payer: Self-pay | Admitting: Surgery

## 2011-09-01 ENCOUNTER — Ambulatory Visit (INDEPENDENT_AMBULATORY_CARE_PROVIDER_SITE_OTHER): Payer: Medicare Other | Admitting: Surgery

## 2011-09-01 VITALS — BP 124/86 | HR 68 | Temp 97.2°F | Resp 16 | Ht 66.0 in | Wt 232.0 lb

## 2011-09-01 DIAGNOSIS — Z09 Encounter for follow-up examination after completed treatment for conditions other than malignant neoplasm: Secondary | ICD-10-CM

## 2011-09-01 NOTE — Progress Notes (Signed)
Subjective:     Patient ID: Lisa [name], female   DOB: 16-Aug-1965, 47 y.o.   MRN: [id number]  HPI She is here for her first postop visit status post umbilical hernia repair with mesh. She is doing well and has no complaints  Review of Systems     Objective:   Physical Exam    On exam, her incision is healing well Assessment:     Patient status post umbilical hernia repair with mesh    Plan:     She will refrain from heavy lifting for 3 more weeks. I will see her back as needed

## 2012-01-04 ENCOUNTER — Ambulatory Visit
Admission: RE | Admit: 2012-01-04 | Discharge: 2012-01-04 | Disposition: A | Discharge status: Home / Self Care | Payer: Medicare Other | Source: Ambulatory Visit | Attending: Family Medicine | Admitting: Family Medicine

## 2012-01-04 ENCOUNTER — Other Ambulatory Visit: Payer: Self-pay | Admitting: Family Medicine

## 2012-01-04 ENCOUNTER — Ambulatory Visit: Payer: Medicare Other | Attending: Family Medicine

## 2012-01-04 DIAGNOSIS — M549 Dorsalgia, unspecified: Secondary | ICD-10-CM

## 2012-01-04 DIAGNOSIS — M545 Low back pain, unspecified: Secondary | ICD-10-CM | POA: Insufficient documentation

## 2012-01-04 DIAGNOSIS — IMO0001 Reserved for inherently not codable concepts without codable children: Secondary | ICD-10-CM | POA: Insufficient documentation

## 2012-01-04 DIAGNOSIS — M2560 Stiffness of unspecified joint, not elsewhere classified: Secondary | ICD-10-CM | POA: Insufficient documentation

## 2012-01-04 DIAGNOSIS — R5381 Other malaise: Secondary | ICD-10-CM | POA: Insufficient documentation

## 2012-01-11 ENCOUNTER — Ambulatory Visit: Payer: Medicare Other | Admitting: Physical Therapy

## 2012-01-15 ENCOUNTER — Ambulatory Visit: Payer: Medicare Other

## 2012-01-17 ENCOUNTER — Ambulatory Visit: Payer: Medicare Other | Admitting: Physical Therapy

## 2012-01-22 ENCOUNTER — Ambulatory Visit: Payer: Medicare Other

## 2012-09-11 ENCOUNTER — Other Ambulatory Visit: Payer: Self-pay

## 2012-09-13 ENCOUNTER — Other Ambulatory Visit: Payer: Self-pay | Admitting: Physician Assistant

## 2012-09-13 DIAGNOSIS — Z1231 Encounter for screening mammogram for malignant neoplasm of breast: Secondary | ICD-10-CM

## 2012-10-04 ENCOUNTER — Ambulatory Visit
Admission: RE | Admit: 2012-10-04 | Discharge: 2012-10-04 | Disposition: A | Discharge status: Home / Self Care | Payer: Medicare Other | Source: Ambulatory Visit | Attending: Physician Assistant | Admitting: Physician Assistant

## 2012-10-04 DIAGNOSIS — Z1231 Encounter for screening mammogram for malignant neoplasm of breast: Secondary | ICD-10-CM

## 2013-07-12 ENCOUNTER — Encounter (HOSPITAL_COMMUNITY): Payer: Self-pay | Admitting: Emergency Medicine

## 2013-07-12 ENCOUNTER — Emergency Department (HOSPITAL_COMMUNITY)
Admission: EM | Admit: 2013-07-12 | Discharge: 2013-07-12 | Disposition: A | Discharge status: Home / Self Care | Payer: Medicare HMO | Attending: Emergency Medicine | Admitting: Emergency Medicine

## 2013-07-12 DIAGNOSIS — F329 Major depressive disorder, single episode, unspecified: Secondary | ICD-10-CM | POA: Insufficient documentation

## 2013-07-12 DIAGNOSIS — Z8709 Personal history of other diseases of the respiratory system: Secondary | ICD-10-CM | POA: Insufficient documentation

## 2013-07-12 DIAGNOSIS — Z791 Long term (current) use of non-steroidal anti-inflammatories (NSAID): Secondary | ICD-10-CM | POA: Insufficient documentation

## 2013-07-12 DIAGNOSIS — M545 Low back pain, unspecified: Secondary | ICD-10-CM | POA: Insufficient documentation

## 2013-07-12 DIAGNOSIS — F3289 Other specified depressive episodes: Secondary | ICD-10-CM | POA: Insufficient documentation

## 2013-07-12 DIAGNOSIS — Z8639 Personal history of other endocrine, nutritional and metabolic disease: Secondary | ICD-10-CM | POA: Insufficient documentation

## 2013-07-12 DIAGNOSIS — M549 Dorsalgia, unspecified: Secondary | ICD-10-CM

## 2013-07-12 DIAGNOSIS — Z79899 Other long term (current) drug therapy: Secondary | ICD-10-CM | POA: Insufficient documentation

## 2013-07-12 DIAGNOSIS — Z8669 Personal history of other diseases of the nervous system and sense organs: Secondary | ICD-10-CM | POA: Insufficient documentation

## 2013-07-12 DIAGNOSIS — Z862 Personal history of diseases of the blood and blood-forming organs and certain disorders involving the immune mechanism: Secondary | ICD-10-CM | POA: Insufficient documentation

## 2013-07-12 MED ORDER — HYDROCODONE-ACETAMINOPHEN 5-325 MG PO TABS
1.0000 | ORAL_TABLET | Freq: Four times a day (QID) | ORAL | Status: DC | PRN
Start: 2013-07-12 — End: 2015-12-07

## 2013-07-12 MED ORDER — DIAZEPAM 5 MG PO TABS
5.0000 mg | ORAL_TABLET | Freq: Once | ORAL | Status: AC
  Administered 2013-07-12: 5 mg via ORAL
  Filled 2013-07-12: qty 1

## 2013-07-12 MED ORDER — DIAZEPAM 5 MG PO TABS: 5.0000 mg | ORAL_TABLET | Freq: Two times a day (BID) | ORAL | Status: AC

## 2013-07-12 MED ORDER — HYDROCODONE-ACETAMINOPHEN 5-325 MG PO TABS
1.0000 | ORAL_TABLET | Freq: Once | ORAL | Status: AC
  Administered 2013-07-12: 1 via ORAL
  Filled 2013-07-12: qty 1

## 2013-07-12 NOTE — ED Notes (Signed)
Pt reports that she has been having lower back pain that goes down into her bilateral legs, states sitting for long periods of time increases pain, but pain is constant for the past few months, no known injuries. Reports she had an injury at work that hurt her neck and shoulder, takes Gabapentin, but this has not helped her back and leg pain. Pt a&o x4, skin warm and dry, ambulatory to triage.

## 2013-07-12 NOTE — ED Provider Notes (Signed)
CSN: [id number]     Arrival date & time 07/12/13  1949 History   First MD Initiated Contact with Patient 07/12/13 2029     Chief Complaint  Patient presents with  . Back Pain     (Consider location/radiation/quality/duration/timing/severity/associated sxs/prior Treatment) HPI Patient presents with ongoing back pain.  He has been there for months, worse over the past week. Pain is severe, sore, in the low back bilaterally, with radiation down the posterior of both legs. Is no new incontinence, falling, weakness. Minimal relief with gabapentin use. No abdominal pain, fevers, chills, diarrhea, vomiting. Patient has multiple pain issues, is on disability. And Past Medical History  Diagnosis Date  . Depression   . Allergic rhinitis, cause unspecified   . Other chronic allergic conjunctivitis   . Blood transfusion   . Hyperlipidemia   . Anemia 08-18-11    iron deficiency.  . Transfusion history 08-18-11    x5 yrs ago-low blood count  . Neuromuscular disorder 08-18-11    left  shoulder chronic pain,remains being evaluated-Dr. Nelva Bush   Past Surgical History  Procedure Laterality Date  . Shoulder surgery  2007    both  . Neck surgery  08-18-11    2009-Dr. Rolena Infante- Fusion cervical with retained hardware.ROM causes some pain still.  . Hand surgery  08-18-11    2009-Bil. reains with some pain and numbness(wrist and palm)  . Abdominal hysterectomy  08-18-11    Vaginal Hysterectomy  . Umbilical hernia repair  08/25/2011    Procedure: HERNIA REPAIR UMBILICAL ADULT;  Surgeon: Harl Bowie, MD;  Location: [facility];  Service: General;  Laterality: N/A;   Family History  Problem Relation Age of Onset  . Cancer Maternal Grandfather     unknown   History  Substance Use Topics  . Smoking status: Never Smoker   . Smokeless tobacco: Not on file  . Alcohol Use: No   OB History   Grav Para Term Preterm Abortions TAB SAB Ect Mult Living                 Review of Systems  Constitutional:        Per HPI, otherwise negative  HENT:       Per HPI, otherwise negative  Respiratory:       Per HPI, otherwise negative  Cardiovascular:       Per HPI, otherwise negative  Gastrointestinal: Negative for vomiting.  Endocrine:       Negative aside from HPI  Genitourinary:       Neg aside from HPI   Musculoskeletal:       Per HPI, otherwise negative  Skin: Negative.   Neurological: Negative for syncope.      Allergies  Ibuprofen  Home Medications   Prior to Admission medications   Medication Sig Start Date End Date Taking? Authorizing Provider  ARIPiprazole (ABILIFY) 5 MG tablet Take 5 mg by mouth daily.   Yes Historical Provider, MD  DULoxetine (CYMBALTA) 60 MG capsule Take 120 mg by mouth at bedtime.    Yes Historical Provider, MD  gabapentin (NEURONTIN) 300 MG capsule Take 300 mg by mouth 3 (three) times daily.   Yes Historical Provider, MD  meloxicam (MOBIC) 7.5 MG tablet Take 15 mg by mouth daily.   Yes Historical Provider, MD  methocarbamol (ROBAXIN) 500 MG tablet Take 500 mg by mouth every 8 (eight) hours.   Yes Historical Provider, MD   BP 121/73  Pulse 90  Temp(Src) 98 F (36.7 C) (Oral)  Resp 16  Ht 5\' 5"  (1.651 m)  Wt 235 lb (106.595 kg)  BMI 39.11 kg/m2  SpO2 100% Physical Exam  Nursing note and vitals reviewed. Constitutional: She is oriented to person, place, and time. She appears well-developed and well-nourished. No distress.  HENT:  Head: Normocephalic and atraumatic.  Eyes: Conjunctivae and EOM are normal.  Pulmonary/Chest: Effort normal and breath sounds normal. No stridor. No respiratory distress.  Abdominal: She exhibits no distension. There is no tenderness.  Musculoskeletal: She exhibits no edema.  No gross deformity, patient flexes both hips independently, with 5/5 strength, though with referred pain in the mid-low back.   Neurological: She is alert and oriented to person, place, and time. No cranial nerve deficit.  Skin: Skin is warm and  dry.  Psychiatric: She has a normal mood and affect.    ED Course  Procedures (including critical care time)  MDM  This patient presents with worsening low back pain.  Patient is hemodynamically stable, has no neurologic complaints, and no incontinence or weakness suggestive of occult neurologic compromise. Patient started on a new analgesia regimen, provided resources to followup with orthopedics for physical therapy and further evaluation/management    Carmin Muskrat, MD 07/12/13 2046

## 2013-07-12 NOTE — Discharge Instructions (Signed)
As discussed, your evaluation today has been largely reassuring.  But, it is important that you monitor your condition carefully, and do not hesitate to return to the ED if you develop new, or concerning changes in your condition.  Otherwise, please follow-up with your physician for appropriate ongoing care. Back Pain, Adult Low back pain is very common. About 1 in 5 people have back pain.The cause of low back pain is rarely dangerous. The pain often gets better over time.About half of people with a sudden onset of back pain feel better in just 2 weeks. About 8 in 10 people feel better by 6 weeks.  CAUSES Some common causes of back pain include:  Strain of the muscles or ligaments supporting the spine.  Wear and tear (degeneration) of the spinal discs.  Arthritis.  Direct injury to the back. DIAGNOSIS Most of the time, the direct cause of low back pain is not known.However, back pain can be treated effectively even when the exact cause of the pain is unknown.Answering your caregiver's questions about your overall health and symptoms is one of the most accurate ways to make sure the cause of your pain is not dangerous. If your caregiver needs more information, he or she may order lab work or imaging tests (X-rays or MRIs).However, even if imaging tests show changes in your back, this usually does not require surgery. HOME CARE INSTRUCTIONS For many people, back pain returns.Since low back pain is rarely dangerous, it is often a condition that people can learn to [facility] their own.   Remain active. It is stressful on the back to sit or stand in one place. Do not sit, drive, or stand in one place for more than 30 minutes at a time. Take short walks on level surfaces as soon as pain allows.Try to increase the length of time you walk each day.  Do not stay in bed.Resting more than 1 or 2 days can delay your recovery.  Do not avoid exercise or work.Your body is made to move.It is not  dangerous to be active, even though your back may hurt.Your back will likely heal faster if you return to being active before your pain is gone.  Pay attention to your body when you bend and lift. Many people have less discomfortwhen lifting if they bend their knees, keep the load close to their bodies,and avoid twisting. Often, the most comfortable positions are those that put less stress on your recovering back.  Find a comfortable position to sleep. Use a firm mattress and lie on your side with your knees slightly bent. If you lie on your back, put a pillow under your knees.  Only take over-the-counter or prescription medicines as directed by your caregiver. Over-the-counter medicines to reduce pain and inflammation are often the most helpful.Your caregiver may prescribe muscle relaxant drugs.These medicines help dull your pain so you can more quickly return to your normal activities and healthy exercise.  Put ice on the injured area.  Put ice in a plastic bag.  Place a towel between your skin and the bag.  Leave the ice on for 15-20 minutes, 03-04 times a day for the first 2 to 3 days. After that, ice and heat may be alternated to reduce pain and spasms.  Ask your caregiver about trying back exercises and gentle massage. This may be of some benefit.  Avoid feeling anxious or stressed.Stress increases muscle tension and can worsen back pain.It is important to recognize when you are anxious or stressed  and learn ways to manage it.Exercise is a great option. SEEK MEDICAL CARE IF:  You have pain that is not relieved with rest or medicine.  You have pain that does not improve in 1 week.  You have new symptoms.  You are generally not feeling well. SEEK IMMEDIATE MEDICAL CARE IF:   You have pain that radiates from your back into your legs.  You develop new bowel or bladder control problems.  You have unusual weakness or numbness in your arms or legs.  You develop nausea or  vomiting.  You develop abdominal pain.  You feel faint. Document Released: 02/13/2005 Document Revised: 08/15/2011 Document Reviewed: 07/04/2010 [facility] Patient Information 2014 [place], [place].

## 2013-08-07 ENCOUNTER — Other Ambulatory Visit: Payer: Self-pay | Admitting: Orthopedic Surgery

## 2013-08-07 DIAGNOSIS — M4800 Spinal stenosis, site unspecified: Secondary | ICD-10-CM

## 2013-08-07 DIAGNOSIS — M549 Dorsalgia, unspecified: Secondary | ICD-10-CM

## 2013-08-07 DIAGNOSIS — M79604 Pain in right leg: Secondary | ICD-10-CM

## 2013-08-13 ENCOUNTER — Ambulatory Visit
Admission: RE | Admit: 2013-08-13 | Discharge: 2013-08-13 | Disposition: A | Discharge status: Home / Self Care | Payer: Medicare Other | Source: Ambulatory Visit | Attending: Orthopedic Surgery | Admitting: Orthopedic Surgery

## 2013-08-13 ENCOUNTER — Inpatient Hospital Stay
Admission: RE | Admit: 2013-08-13 | Discharge: 2013-08-13 | Disposition: A | Discharge status: Home / Self Care | Payer: Self-pay | Source: Ambulatory Visit | Attending: Orthopedic Surgery | Admitting: Orthopedic Surgery

## 2013-08-13 ENCOUNTER — Other Ambulatory Visit: Payer: Self-pay | Admitting: Orthopedic Surgery

## 2013-08-13 VITALS — BP 103/67 | HR 71

## 2013-08-13 DIAGNOSIS — M4800 Spinal stenosis, site unspecified: Secondary | ICD-10-CM

## 2013-08-13 DIAGNOSIS — M549 Dorsalgia, unspecified: Secondary | ICD-10-CM

## 2013-08-13 DIAGNOSIS — M79604 Pain in right leg: Secondary | ICD-10-CM

## 2013-08-13 MED ORDER — DIAZEPAM 5 MG PO TABS
10.0000 mg | ORAL_TABLET | Freq: Once | ORAL | Status: AC
  Administered 2013-08-13: 10 mg via ORAL

## 2013-08-13 MED ORDER — ONDANSETRON HCL 4 MG/2ML IJ SOLN
4.0000 mg | Freq: Once | INTRAMUSCULAR | Status: AC
  Administered 2013-08-13: 4 mg via INTRAMUSCULAR

## 2013-08-13 MED ORDER — IOHEXOL 180 MG/ML  SOLN
15.0000 mL | Freq: Once | INTRAMUSCULAR | Status: AC | PRN
  Administered 2013-08-13: 15 mL via INTRAVENOUS

## 2013-08-13 MED ORDER — MEPERIDINE HCL 100 MG/ML IJ SOLN
100.0000 mg | Freq: Once | INTRAMUSCULAR | Status: AC
  Administered 2013-08-13: 100 mg via INTRAMUSCULAR

## 2013-08-13 NOTE — Progress Notes (Signed)
Pt has been off Cymbalta and Abilify since Sunday. Discharge instructions explained to pt.

## 2013-08-13 NOTE — Discharge Instructions (Signed)
Myelogram Discharge Instructions  1. Go home and rest quietly for the next 24 hours.  It is important to lie flat for the next 24 hours.  Get up only to go to the restroom.  You may lie in the bed or on a couch on your back, your stomach, your left side or your right side.  You may have one pillow under your head.  You may have pillows between your knees while you are on your side or under your knees while you are on your back.  2. DO NOT drive today.  Recline the seat as far back as it will go, while still wearing your seat belt, on the way home.  3. You may get up to go to the bathroom as needed.  You may sit up for 10 minutes to eat.  You may resume your normal diet and medications unless otherwise indicated.  Drink lots of extra fluids today and tomorrow.  4. The incidence of headache, nausea, or vomiting is about 5% (one in 20 patients).  If you develop a headache, lie flat and drink plenty of fluids until the headache goes away.  Caffeinated beverages may be helpful.  If you develop severe nausea and vomiting or a headache that does not go away with flat bed rest, call [phone].  5. You may resume normal activities after your 24 hours of bed rest is over; however, do not exert yourself strongly or do any heavy lifting tomorrow. If when you get up you have a headache when standing, go back to bed and force fluids for another 24 hours.  6. Call your physician for a follow-up appointment.  The results of your myelogram will be sent directly to your physician by the following day.  7. If you have any questions or if complications develop after you arrive home, please call [phone].  Discharge instructions have been explained to the patient.  The patient, or the person responsible for the patient, fully understands these instructions.      May resume Cymbalta and Abilify on August 14, 2013, after 11:00 am.

## 2014-08-20 ENCOUNTER — Other Ambulatory Visit (HOSPITAL_COMMUNITY): Payer: Self-pay | Admitting: Orthopedic Surgery

## 2014-08-20 DIAGNOSIS — M961 Postlaminectomy syndrome, not elsewhere classified: Secondary | ICD-10-CM

## 2014-09-01 ENCOUNTER — Ambulatory Visit (HOSPITAL_COMMUNITY)
Admission: RE | Admit: 2014-09-01 | Discharge: 2014-09-01 | Disposition: A | Discharge status: Home / Self Care | Payer: Medicare HMO | Source: Ambulatory Visit | Attending: Orthopedic Surgery | Admitting: Orthopedic Surgery

## 2014-09-01 DIAGNOSIS — M542 Cervicalgia: Secondary | ICD-10-CM | POA: Insufficient documentation

## 2014-09-01 DIAGNOSIS — M25512 Pain in left shoulder: Secondary | ICD-10-CM | POA: Diagnosis not present

## 2014-09-01 DIAGNOSIS — R531 Weakness: Secondary | ICD-10-CM | POA: Diagnosis not present

## 2014-09-01 DIAGNOSIS — R200 Anesthesia of skin: Secondary | ICD-10-CM | POA: Diagnosis not present

## 2014-09-01 DIAGNOSIS — Z981 Arthrodesis status: Secondary | ICD-10-CM | POA: Insufficient documentation

## 2014-09-01 DIAGNOSIS — M961 Postlaminectomy syndrome, not elsewhere classified: Secondary | ICD-10-CM

## 2014-09-01 MED ORDER — GADOBENATE DIMEGLUMINE 529 MG/ML IV SOLN
20.0000 mL | Freq: Once | INTRAVENOUS | Status: AC | PRN
  Administered 2014-09-01: 20 mL via INTRAVENOUS

## 2014-11-24 ENCOUNTER — Encounter: Payer: Self-pay | Admitting: Pulmonary Disease

## 2014-11-24 ENCOUNTER — Ambulatory Visit (INDEPENDENT_AMBULATORY_CARE_PROVIDER_SITE_OTHER): Payer: Medicare HMO | Admitting: Pulmonary Disease

## 2014-11-24 VITALS — BP 112/82 | HR 75 | Ht 66.0 in | Wt 243.6 lb

## 2014-11-24 DIAGNOSIS — G4733 Obstructive sleep apnea (adult) (pediatric): Secondary | ICD-10-CM | POA: Insufficient documentation

## 2014-11-24 NOTE — Assessment & Plan Note (Addendum)
Given excessive daytime somnolence, narrow pharyngeal exam, witnessed apneas & loud snoring, obstructive sleep apnea is very likely & an overnight polysomnogram will be scheduled as a split study. The pathophysiology of obstructive sleep apnea , it's cardiovascular consequences & modes of treatment including CPAP were discused with the patient in detail & they evidenced understanding.  Pretest probability is high 

## 2014-11-24 NOTE — Progress Notes (Signed)
Subjective:    Patient ID: Lisa [name], female    DOB: [birth year], 49 y.o.   MRN: [id number]  HPI  49 year old obese woman with depression presents for evaluation of sleep-disordered breathing. Her husband has noted loud snoring and witnessed apneas. Epworth sleepiness score is 19 and she report sleepiness in various social situations. Bedtime is around 11 PM, sleep latency about 20 minutes, she sleeps on her left with 3 pillows, reports nightmares when she sleeps on her back, reports up to 5 nocturnal awakenings due to nocturia and is out of bed at 7 AM feeling tired with dryness of mouth and occasional headache. She has occasionally woken herself up to 2 choking episode There is no history suggestive of cataplexy, sleep paralysis or parasomnias She is under psychiatrist's care for depression. Her father and eldest son who has mental health issues have OSA and used CPAP     Past Medical History  Diagnosis Date  . Depression   . Allergic rhinitis, cause unspecified   . Other chronic allergic conjunctivitis   . Blood transfusion   . Hyperlipidemia   . Anemia 08-18-11    iron deficiency.  . Transfusion history 08-18-11    x5 yrs ago-low blood count  . Neuromuscular disorder 08-18-11    left  shoulder chronic pain,remains being evaluated-Dr. Nelva Bush    Past Surgical History  Procedure Laterality Date  . Shoulder surgery  2007    both  . Neck surgery  08-18-11    2009-Dr. Rolena Infante- Fusion cervical with retained hardware.ROM causes some pain still.  . Hand surgery  08-18-11    2009-Bil. reains with some pain and numbness(wrist and palm)  . Abdominal hysterectomy  08-18-11    Vaginal Hysterectomy  . Umbilical hernia repair  08/25/2011    Procedure: HERNIA REPAIR UMBILICAL ADULT;  Surgeon: Harl Bowie, MD;  Location: [facility];  Service: General;  Laterality: N/A;    Allergies  Allergen Reactions  . Ibuprofen     Stomach upset.    Social History   Social History  . Marital  Status: Married    Spouse Name: N/A  . Number of Children: N/A  . Years of Education: N/A   Occupational History  . Not on file.   Social History Main Topics  . Smoking status: Never Smoker   . Smokeless tobacco: Not on file  . Alcohol Use: No  . Drug Use: No  . Sexual Activity: Yes    Birth Control/ Protection: None   Other Topics Concern  . Not on file   Social History Narrative    Family History  Problem Relation Age of Onset  . Cancer Maternal Grandfather     unknown    Review of Systems  Constitutional: Negative for fever, chills and unexpected weight change.  HENT: Negative for congestion, dental problem, ear pain, nosebleeds, postnasal drip, rhinorrhea, sinus pressure, sneezing, sore throat, trouble swallowing and voice change.   Eyes: Negative for visual disturbance.  Respiratory: Negative for cough, choking and shortness of breath.   Cardiovascular: Negative for chest pain and leg swelling.  Gastrointestinal: Negative for vomiting, abdominal pain and diarrhea.  Genitourinary: Negative for difficulty urinating.  Musculoskeletal: Negative for arthralgias.  Skin: Negative for rash.  Neurological: Negative for tremors, syncope and headaches.  Hematological: Does not bruise/bleed easily.       Objective:   Physical Exam   Gen. Pleasant, obese, in no distress, normal affect ENT - no lesions, no post nasal drip, class 2-3  airway Neck: No JVD, no thyromegaly, no carotid bruits Lungs: no use of accessory muscles, no dullness to percussion, decreased without rales or rhonchi  Cardiovascular: Rhythm regular, heart sounds  normal, no murmurs or gallops, no peripheral edema Abdomen: soft and non-tender, no hepatosplenomegaly, BS normal. Musculoskeletal: No deformities, no cyanosis or clubbing Neuro:  alert, non focal, no tremors        Assessment & Plan:

## 2014-11-24 NOTE — Patient Instructions (Signed)
Sleep study will be scheduled 

## 2015-02-01 ENCOUNTER — Institutional Professional Consult (permissible substitution): Payer: Medicare Other | Admitting: Pulmonary Disease

## 2015-02-14 ENCOUNTER — Ambulatory Visit (HOSPITAL_BASED_OUTPATIENT_CLINIC_OR_DEPARTMENT_OTHER): Payer: Medicare HMO | Attending: Pulmonary Disease

## 2015-02-14 VITALS — Ht 65.0 in | Wt 246.0 lb

## 2015-02-14 DIAGNOSIS — G4733 Obstructive sleep apnea (adult) (pediatric): Secondary | ICD-10-CM

## 2015-02-14 DIAGNOSIS — R0683 Snoring: Secondary | ICD-10-CM | POA: Diagnosis not present

## 2015-03-02 ENCOUNTER — Telehealth: Payer: Self-pay | Admitting: Pulmonary Disease

## 2015-03-02 DIAGNOSIS — G4733 Obstructive sleep apnea (adult) (pediatric): Secondary | ICD-10-CM | POA: Diagnosis not present

## 2015-03-02 NOTE — Progress Notes (Signed)
Patient Name: Lisa [name], Lisa [name] Date: 02/14/2015 Gender: Female D.O.B: 21-Feb-1966 Age (years): 77 Referring Provider: Kara Mead MD, ABSM Height (inches): 65 Interpreting Physician: Kara Mead MD, ABSM Weight (lbs): 246 RPSGT: Madelon Lips BMI: 41 MRN: [id number] Neck Size: 15.50   CLINICAL INFORMATION Sleep Study Type: NPSG Indication for sleep study: OSA Epworth Sleepiness Score:   SLEEP STUDY TECHNIQUE As per the AASM Manual for the Scoring of Sleep and Associated Events v2.3 (April 2016) with a hypopnea requiring 4% desaturations. The channels recorded and monitored were frontal, central and occipital EEG, electrooculogram (EOG), submentalis EMG (chin), nasal and oral airflow, thoracic and abdominal wall motion, anterior tibialis EMG, snore microphone, electrocardiogram, and pulse oximetry.   MEDICATIONS Patient's medications include: N/A. Medications self-administered by patient during sleep study : No sleep medicine administered.   SLEEP ARCHITECTURE The study was initiated at 10:12:19 PM and ended at 4:26:36 AM. Sleep onset time was 11.6 minutes and the sleep efficiency was 91.5%. The total sleep time was 342.6 minutes. Stage REM latency was 137.5 minutes. The patient spent 9.63% of the night in stage N1 sleep, 74.90% in stage N2 sleep, 0.88% in stage N3 and 14.59% in REM. Alpha intrusion was absent. Supine sleep was 12.59%.   RESPIRATORY PARAMETERS The overall apnea/hypopnea index (AHI) was 2.1 per hour. There were 7 total apneas, including 3 obstructive, 4 central and 0 mixed apneas. There were 5 hypopneas and 2 RERAs. The AHI during Stage REM sleep was 3.6 per hour. AHI while supine was 2.8 per hour. The mean oxygen saturation was 95.94%. The minimum SpO2 during sleep was 92.00%. Moderate snoring was noted during this study.  CARDIAC DATA The 2 lead EKG demonstrated sinus rhythm. The mean heart rate was 78.50 beats per minute. Other EKG findings include:  None.   LEG MOVEMENT DATA The total PLMS were 0 with a resulting PLMS index of 0.00. Associated arousal with leg movement index was 0.0 .   IMPRESSIONS - No significant obstructive sleep apnea occurred during this study (AHI = 2.1/h).She slept mostly on her right side, supine sleep was noted - No significant central sleep apnea occurred during this study (CAI = 0.7/h). - Mild oxygen desaturation was noted during this study (Min O2 = 92.00%). - The patient snored with Moderate snoring volume. - No cardiac abnormalities were noted during this study. - Clinically significant periodic limb movements did not occur during sleep. No significant associated arousals.   RECOMMENDATIONS - Avoid alcohol, sedatives and other CNS depressants that may worsen sleep apnea and disrupt normal sleep architecture. - Sleep hygiene should be reviewed to assess factors that may improve sleep quality. - Weight management and regular exercise should be initiated or continued if appropriate.  Kara Mead MD. [facility]. [facility]

## 2015-03-02 NOTE — Telephone Encounter (Signed)
No clinically significant sleep apnea noted during study

## 2015-03-03 NOTE — Telephone Encounter (Signed)
LMOMTCB x 1 

## 2015-03-03 NOTE — Telephone Encounter (Signed)
Spoke with pt, aware of results.  Nothing further needed.  

## 2015-03-03 NOTE — Telephone Encounter (Signed)
[id number] calling back

## 2015-12-06 ENCOUNTER — Emergency Department (HOSPITAL_COMMUNITY)
Admission: EM | Admit: 2015-12-06 | Discharge: 2015-12-07 | Disposition: A | Discharge status: Home / Self Care | Payer: No Typology Code available for payment source | Attending: Emergency Medicine | Admitting: Emergency Medicine

## 2015-12-06 ENCOUNTER — Encounter (HOSPITAL_COMMUNITY): Payer: Self-pay | Admitting: Emergency Medicine

## 2015-12-06 ENCOUNTER — Emergency Department (HOSPITAL_COMMUNITY): Payer: No Typology Code available for payment source

## 2015-12-06 DIAGNOSIS — Y939 Activity, unspecified: Secondary | ICD-10-CM | POA: Diagnosis not present

## 2015-12-06 DIAGNOSIS — M542 Cervicalgia: Secondary | ICD-10-CM | POA: Diagnosis not present

## 2015-12-06 DIAGNOSIS — Z79899 Other long term (current) drug therapy: Secondary | ICD-10-CM | POA: Diagnosis not present

## 2015-12-06 DIAGNOSIS — M545 Low back pain: Secondary | ICD-10-CM

## 2015-12-06 DIAGNOSIS — Y999 Unspecified external cause status: Secondary | ICD-10-CM | POA: Diagnosis not present

## 2015-12-06 DIAGNOSIS — Y92410 Unspecified street and highway as the place of occurrence of the external cause: Secondary | ICD-10-CM | POA: Diagnosis not present

## 2015-12-06 DIAGNOSIS — R52 Pain, unspecified: Secondary | ICD-10-CM

## 2015-12-06 MED ORDER — HYDROCODONE-ACETAMINOPHEN 5-325 MG PO TABS
1.0000 | ORAL_TABLET | Freq: Once | ORAL | Status: AC
  Administered 2015-12-07: 1 via ORAL
  Filled 2015-12-06: qty 1

## 2015-12-06 NOTE — ED Notes (Signed)
Patient transported to X-ray 
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
## 2015-12-07 DIAGNOSIS — M545 Low back pain: Secondary | ICD-10-CM | POA: Diagnosis not present

## 2015-12-07 MED ORDER — HYDROCODONE-ACETAMINOPHEN 5-325 MG PO TABS: 1.0000 | ORAL_TABLET | Freq: Four times a day (QID) | ORAL | 0 refills | Status: AC | PRN

## 2015-12-07 NOTE — Discharge Instructions (Signed)
Xrays of your neck and lower back were negative for any fracture or dislocation. Take the Norco as prescribed as needed for pain. Follow-up with your primary care provider in 3 days if symptoms are not improving. Return immediately to the emergency department if you experience numbness/timing, weakness, loss of bowel bladder function, inability to urinate, abdominal pain, nausea, vomiting, headache, dizziness, you pass out or any other concerning symptoms.
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
## 2016-03-01 ENCOUNTER — Other Ambulatory Visit: Payer: Self-pay | Admitting: Gastroenterology

## 2016-03-01 DIAGNOSIS — R1033 Periumbilical pain: Secondary | ICD-10-CM

## 2016-03-03 ENCOUNTER — Ambulatory Visit
Admission: RE | Admit: 2016-03-03 | Discharge: 2016-03-03 | Disposition: A | Discharge status: Home / Self Care | Payer: Medicare Other | Source: Ambulatory Visit | Attending: Gastroenterology | Admitting: Gastroenterology

## 2016-03-03 DIAGNOSIS — R1033 Periumbilical pain: Secondary | ICD-10-CM

## 2016-03-03 MED ORDER — IOPAMIDOL (ISOVUE-300) INJECTION 61%
100.0000 mL | Freq: Once | INTRAVENOUS | Status: AC | PRN
  Administered 2016-03-03: 100 mL via INTRAVENOUS

## 2016-04-10 ENCOUNTER — Other Ambulatory Visit: Payer: Self-pay | Admitting: Internal Medicine

## 2016-04-10 DIAGNOSIS — R5381 Other malaise: Secondary | ICD-10-CM

## 2016-04-17 ENCOUNTER — Other Ambulatory Visit: Payer: Self-pay | Admitting: Internal Medicine

## 2016-05-02 ENCOUNTER — Other Ambulatory Visit: Payer: Self-pay | Admitting: Internal Medicine

## 2017-07-02 ENCOUNTER — Other Ambulatory Visit (HOSPITAL_COMMUNITY): Payer: Self-pay | Admitting: Student

## 2017-07-02 DIAGNOSIS — M5412 Radiculopathy, cervical region: Secondary | ICD-10-CM

## 2017-07-06 ENCOUNTER — Ambulatory Visit (HOSPITAL_COMMUNITY): Admission: RE | Admit: 2017-07-06 | Payer: Medicare HMO | Source: Ambulatory Visit

## 2017-07-13 ENCOUNTER — Ambulatory Visit (HOSPITAL_COMMUNITY)
Admission: RE | Admit: 2017-07-13 | Discharge: 2017-07-13 | Disposition: A | Discharge status: Home / Self Care | Payer: Medicare HMO | Source: Ambulatory Visit | Attending: Student | Admitting: Student

## 2017-07-13 DIAGNOSIS — M5412 Radiculopathy, cervical region: Secondary | ICD-10-CM | POA: Diagnosis not present

## 2017-07-13 DIAGNOSIS — M50222 Other cervical disc displacement at C5-C6 level: Secondary | ICD-10-CM | POA: Insufficient documentation

## 2017-09-07 LAB — GLUCOSE, POCT (MANUAL RESULT ENTRY): POC Glucose: 63 mg/dl — AB (ref 70–99)

## 2018-08-29 ENCOUNTER — Other Ambulatory Visit: Payer: Self-pay | Admitting: Internal Medicine

## 2018-08-29 ENCOUNTER — Ambulatory Visit
Admission: RE | Admit: 2018-08-29 | Discharge: 2018-08-29 | Disposition: A | Discharge status: Home / Self Care | Payer: Medicare HMO | Source: Ambulatory Visit | Attending: Internal Medicine | Admitting: Internal Medicine

## 2018-08-29 ENCOUNTER — Ambulatory Visit
Admission: RE | Admit: 2018-08-29 | Discharge: 2018-08-29 | Disposition: A | Discharge status: Home / Self Care | Payer: Medicare Other | Source: Ambulatory Visit | Attending: Internal Medicine | Admitting: Internal Medicine

## 2018-08-29 DIAGNOSIS — M25552 Pain in left hip: Secondary | ICD-10-CM

## 2018-08-29 DIAGNOSIS — M545 Low back pain, unspecified: Secondary | ICD-10-CM

## 2018-08-29 DIAGNOSIS — M25551 Pain in right hip: Secondary | ICD-10-CM

## 2018-09-05 ENCOUNTER — Other Ambulatory Visit: Payer: Self-pay | Admitting: Internal Medicine

## 2018-09-05 DIAGNOSIS — M545 Low back pain, unspecified: Secondary | ICD-10-CM

## 2018-09-05 DIAGNOSIS — G8911 Acute pain due to trauma: Secondary | ICD-10-CM

## 2018-09-06 ENCOUNTER — Other Ambulatory Visit: Payer: Self-pay | Admitting: Internal Medicine

## 2018-09-27 ENCOUNTER — Ambulatory Visit
Admission: RE | Admit: 2018-09-27 | Discharge: 2018-09-27 | Disposition: A | Discharge status: Home / Self Care | Payer: Medicare Other | Source: Ambulatory Visit | Attending: Internal Medicine | Admitting: Internal Medicine

## 2018-09-27 ENCOUNTER — Other Ambulatory Visit: Payer: Self-pay

## 2018-09-27 DIAGNOSIS — G8911 Acute pain due to trauma: Secondary | ICD-10-CM

## 2018-09-27 DIAGNOSIS — M545 Low back pain, unspecified: Secondary | ICD-10-CM

## 2018-09-27 MED ORDER — GADOBENATE DIMEGLUMINE 529 MG/ML IV SOLN
20.0000 mL | Freq: Once | INTRAVENOUS | Status: AC | PRN
  Administered 2018-09-27: 20 mL via INTRAVENOUS

## 2018-10-31 ENCOUNTER — Other Ambulatory Visit: Payer: Self-pay

## 2018-10-31 DIAGNOSIS — Z20822 Contact with and (suspected) exposure to covid-19: Secondary | ICD-10-CM

## 2018-11-01 LAB — NOVEL CORONAVIRUS, NAA: SARS-CoV-2, NAA: NOT DETECTED

## 2019-09-12 ENCOUNTER — Other Ambulatory Visit: Payer: Self-pay

## 2019-09-12 DIAGNOSIS — I739 Peripheral vascular disease, unspecified: Secondary | ICD-10-CM

## 2019-09-17 ENCOUNTER — Emergency Department (HOSPITAL_COMMUNITY)
Admission: EM | Admit: 2019-09-17 | Discharge: 2019-09-17 | Disposition: A | Discharge status: Home / Self Care | Payer: Medicare HMO | Attending: Emergency Medicine | Admitting: Emergency Medicine

## 2019-09-17 ENCOUNTER — Encounter (HOSPITAL_COMMUNITY): Payer: Self-pay | Admitting: Emergency Medicine

## 2019-09-17 ENCOUNTER — Other Ambulatory Visit: Payer: Self-pay

## 2019-09-17 DIAGNOSIS — T8089XA Other complications following infusion, transfusion and therapeutic injection, initial encounter: Secondary | ICD-10-CM

## 2019-09-17 DIAGNOSIS — M7918 Myalgia, other site: Secondary | ICD-10-CM

## 2019-09-17 DIAGNOSIS — M7602 Gluteal tendinitis, left hip: Secondary | ICD-10-CM | POA: Insufficient documentation

## 2019-09-17 DIAGNOSIS — B029 Zoster without complications: Secondary | ICD-10-CM | POA: Diagnosis present

## 2019-09-17 MED ORDER — CEPHALEXIN 500 MG PO CAPS: 500.0000 mg | ORAL_CAPSULE | Freq: Four times a day (QID) | ORAL | 0 refills | Status: AC

## 2019-09-17 MED ORDER — DIPHENHYDRAMINE HCL 25 MG PO CAPS
25.0000 mg | ORAL_CAPSULE | Freq: Once | ORAL | Status: AC
  Administered 2019-09-17: 25 mg via ORAL
  Filled 2019-09-17: qty 1

## 2019-09-17 MED ORDER — CEPHALEXIN 500 MG PO CAPS
500.0000 mg | ORAL_CAPSULE | Freq: Once | ORAL | Status: AC
  Administered 2019-09-17: 500 mg via ORAL
  Filled 2019-09-17: qty 1

## 2019-09-17 NOTE — ED Provider Notes (Signed)
[facility] DEPT Provider Note   CSN: [id number] Arrival date & time: 09/17/19  2010     History Chief Complaint  Patient presents with  . Allergic Reaction    Lisa [name] is a 54 y.o. female.  HPI Patient is a 54 year old female with medical history as noted below.  Patient states she recently was diagnosed with herpes zoster.  She has been experiencing neuralgia along the site in the right upper extremity as well as the right abdomen.  She was evaluated 2 days ago and states that she received a "steroid injection" in the left gluteal region for management of her sx.  Today she began experiencing pruritus as well as mild pain and erythema along the site.  No drainage.  She has no other complaints at this time.  No fevers, chills, chest pain, shortness of breath.    Past Medical History:  Diagnosis Date  . Allergic rhinitis, cause unspecified   . Anemia 08-18-11   iron deficiency.  . Blood transfusion   . Depression   . Hyperlipidemia   . Neuromuscular disorder ([place]) 08-18-11   left  shoulder chronic pain,remains being evaluated-Dr. Nelva Bush  . Other chronic allergic conjunctivitis   . Transfusion history 08-18-11   x5 yrs ago-low blood count    Patient Active Problem List   Diagnosis Date Noted  . OSA (obstructive sleep apnea) 11/24/2014  . Umbilical hernia 16/38/4665    Past Surgical History:  Procedure Laterality Date  . ABDOMINAL HYSTERECTOMY  08-18-11   Vaginal Hysterectomy  . HAND SURGERY  08-18-11   2009-Bil. reains with some pain and numbness(wrist and palm)  . NECK SURGERY  08-18-11   2009-DrRolena Infante- Fusion cervical with retained hardware.ROM causes some pain still.  Marland Kitchen SHOULDER SURGERY  2007   both  . UMBILICAL HERNIA REPAIR  08/25/2011   Procedure: HERNIA REPAIR UMBILICAL ADULT;  Surgeon: Harl Bowie, MD;  Location: [facility];  Service: General;  Laterality: N/A;     OB History   No obstetric history on file.      Family History  Problem Relation Age of Onset  . Cancer Maternal Grandfather        unknown    Social History   Tobacco Use  . Smoking status: Never Smoker  Substance Use Topics  . Alcohol use: No  . Drug use: No    Home Medications Prior to Admission medications   Medication Sig Start Date End Date Taking? Authorizing Provider  ARIPiprazole (ABILIFY) 5 MG tablet Take 5 mg by mouth daily.    [provider]  diazepam (VALIUM) 5 MG tablet Take 1 tablet (5 mg total) by mouth 2 (two) times daily. Patient not taking: Reported on 12/06/2015 07/12/13   Carmin Muskrat, MD  DULoxetine (CYMBALTA) 60 MG capsule Take 120 mg by mouth at bedtime.     [provider]  gabapentin (NEURONTIN) 300 MG capsule Take 300 mg by mouth 3 (three) times daily.    [provider]  HYDROcodone-acetaminophen (NORCO/VICODIN) 5-325 MG tablet Take 1 tablet by mouth every 6 (six) hours as needed for moderate pain. 12/07/15   Focht, Fraser Din, PA  meloxicam (MOBIC) 7.5 MG tablet Take 15 mg by mouth daily.    [provider]    Allergies    Ibuprofen  Review of Systems   Review of Systems  Constitutional: Negative for chills and fever.  Respiratory: Negative for shortness of breath.   Cardiovascular: Negative for chest pain.  Musculoskeletal: Positive for myalgias.  Skin: Positive for color change.   Physical Exam Updated Vital Signs BP (!) 125/109   Pulse 80   Temp 99 F (37.2 C)   Resp 18   SpO2 99%   Physical Exam Vitals and nursing note reviewed.  Constitutional:      General: She is not in acute distress.    Appearance: She is well-developed.  HENT:     Head: Normocephalic and atraumatic.     Right Ear: External ear normal.     Left Ear: External ear normal.  Eyes:     General: No scleral icterus.       Right eye: No discharge.        Left eye: No discharge.     Conjunctiva/sclera: Conjunctivae normal.  Neck:     Trachea: No tracheal  deviation.  Cardiovascular:     Rate and Rhythm: Normal rate.  Pulmonary:     Effort: Pulmonary effort is normal. No respiratory distress.     Breath sounds: No stridor.  Abdominal:     General: There is no distension.  Musculoskeletal:        General: No swelling or deformity.     Cervical back: Neck supple.  Skin:    General: Skin is warm and dry.     Findings: No rash.     Comments: 6 to 7 cm firm region noted to the left gluteal region.  Small amount of central erythema.  Overlying excoriations.  No fluctuance noted.  Neurological:     Mental Status: She is alert.     Cranial Nerves: Cranial nerve deficit: no gross deficits.    ED Results / Procedures / Treatments   Labs (all labs ordered are listed, but only abnormal results are displayed) Labs Reviewed - No data to display  EKG None  Radiology No results found.  Procedures Procedures (including critical care time)  Medications Ordered in ED Medications  cephALEXin (KEFLEX) capsule 500 mg (has no administration in time range)  diphenhydrAMINE (BENADRYL) capsule 25 mg (has no administration in time range)   ED Course  I have reviewed the triage vital signs and the nursing notes.  Pertinent labs & imaging results that were available during my care of the patient were reviewed by me and considered in my medical decision making (see chart for details).    MDM Rules/Calculators/A&P                          Pt is a 54 y.o. female that present with a history, physical exam, ED Clinical Course as noted above.   Patient presents with region of mild swelling as well as pruritus noted to the left gluteal region.  Patient had a steroid injection in the region 2 days ago.  Symptoms appear to be secondary to pruritus but due to the mild erythema central to the region am going to d/c on keflex. Recommended use of benadryl for pruritus.  Will give a dose of both in the ED.  Patient has a PCP and is going to follow-up with  them if her symptoms do not begin to improve in the next 24 hours.  She understands she can return to the emergency department any new or worsening symptoms.  Her questions were answered and she was amicable to time of discharge.  Her vital signs are stable.  Patient discharged to home/self care.  Condition at discharge: Stable  Note: Portions of this  report may have been transcribed using voice recognition software. Every effort was made to ensure accuracy; however, inadvertent computerized transcription errors may be present.   Final Clinical Impression(s) / ED Diagnoses Final diagnoses:  Gluteal pain  Injection site irritation, initial encounter   Rx / DC Orders ED Discharge Orders         Ordered    cephALEXin (KEFLEX) 500 MG capsule  4 times daily     Discontinue  Reprint     09/17/19 2249           Rayna Sexton, PA-C 09/17/19 2252    Hayden Rasmussen, MD 09/18/19 [phone]

## 2019-09-17 NOTE — ED Triage Notes (Signed)
Patient c/o pain to left buttock where steroid injection was placed x2 days ago. Reports itching at site.
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
## 2019-09-23 ENCOUNTER — Other Ambulatory Visit: Payer: Self-pay

## 2019-09-23 ENCOUNTER — Ambulatory Visit (HOSPITAL_COMMUNITY)
Admission: RE | Admit: 2019-09-23 | Discharge: 2019-09-23 | Disposition: A | Discharge status: Home / Self Care | Payer: Medicare HMO | Source: Ambulatory Visit | Attending: Vascular Surgery | Admitting: Vascular Surgery

## 2019-09-23 ENCOUNTER — Encounter: Payer: Self-pay | Admitting: Vascular Surgery

## 2019-09-23 ENCOUNTER — Ambulatory Visit (INDEPENDENT_AMBULATORY_CARE_PROVIDER_SITE_OTHER): Payer: Medicare HMO | Admitting: Vascular Surgery

## 2019-09-23 DIAGNOSIS — M79605 Pain in left leg: Secondary | ICD-10-CM

## 2019-09-23 DIAGNOSIS — M79604 Pain in right leg: Secondary | ICD-10-CM

## 2019-09-23 DIAGNOSIS — I739 Peripheral vascular disease, unspecified: Secondary | ICD-10-CM | POA: Insufficient documentation

## 2019-09-23 DIAGNOSIS — M79606 Pain in leg, unspecified: Secondary | ICD-10-CM | POA: Insufficient documentation

## 2019-09-23 NOTE — Progress Notes (Signed)
Patient name: Lisa [name] MRN: [id number] DOB: 01-01-1966 Sex: female  REASON FOR CONSULT: Evaluate for atherosclerosis and PAD   HPI: Lisa [name] is a 54 y.o. female, with history of hyperlipidemia as well as chronic back pain that presents for evaluation of atherosclerosis in her lower extremities with concern for PAD.  She describes since earlier this year she has had lower back pain with bilateral lower extremity radiculopathy and numbness that radiates into her feet.  She had back surgery with Dr. Ronnald Ramp did not see any significant improvement.  She is here today after being seen by her primary care doctor.  She denies any previous lower extremity interventions.  She does not smoke.  Pain is equal in both feet and feels it typically radiates down from her back.  No wound healing problems with her lower extremities.  Pain often equal with rest or walking.    Past Medical History:  Diagnosis Date  . Allergic rhinitis, cause unspecified   . Anemia 08-18-11   iron deficiency.  . Blood transfusion   . Depression   . Hyperlipidemia   . Neuromuscular disorder ([place]) 08-18-11   left  shoulder chronic pain,remains being evaluated-Dr. Nelva Bush  . Other chronic allergic conjunctivitis   . Transfusion history 08-18-11   x5 yrs ago-low blood count    Past Surgical History:  Procedure Laterality Date  . ABDOMINAL HYSTERECTOMY  08-18-11   Vaginal Hysterectomy  . HAND SURGERY  08-18-11   2009-Bil. reains with some pain and numbness(wrist and palm)  . NECK SURGERY  08-18-11   2009-DrRolena Infante- Fusion cervical with retained hardware.ROM causes some pain still.  Marland Kitchen SHOULDER SURGERY  2007   both  . UMBILICAL HERNIA REPAIR  08/25/2011   Procedure: HERNIA REPAIR UMBILICAL ADULT;  Surgeon: Harl Bowie, MD;  Location: [facility];  Service: General;  Laterality: N/A;    Family History  Problem Relation Age of Onset  . Cancer Maternal Grandfather        unknown    SOCIAL HISTORY: Social  History   Socioeconomic History  . Marital status: Married    Spouse name: Not on file  . Number of children: Not on file  . Years of education: Not on file  . Highest education level: Not on file  Occupational History  . Not on file  Tobacco Use  . Smoking status: Never Smoker  Substance and Sexual Activity  . Alcohol use: No  . Drug use: No  . Sexual activity: Yes    Birth control/protection: None  Other Topics Concern  . Not on file  Social History Narrative  . Not on file   Social Determinants of Health   Financial Resource Strain:   . Difficulty of Paying Living Expenses:   Food Insecurity:   . Worried About Charity fundraiser in the Last Year:   . Arboriculturist in the Last Year:   Transportation Needs:   . Film/video editor (Medical):   Marland Kitchen Lack of Transportation (Non-Medical):   Physical Activity:   . Days of Exercise per Week:   . Minutes of Exercise per Session:   Stress:   . Feeling of Stress :   Social Connections:   . Frequency of Communication with Friends and Family:   . Frequency of Social Gatherings with Friends and Family:   . Attends Religious Services:   . Active Member of Clubs or Organizations:   . Attends Archivist Meetings:   .  Marital Status:   Intimate Partner Violence:   . Fear of Current or Ex-Partner:   . Emotionally Abused:   Marland Kitchen Physically Abused:   . Sexually Abused:     Allergies  Allergen Reactions  . Ibuprofen     Stomach upset.     Current Outpatient Medications  Medication Sig Dispense Refill  . ARIPiprazole (ABILIFY) 5 MG tablet Take 5 mg by mouth daily.    . cephALEXin (KEFLEX) 500 MG capsule Take 1 capsule (500 mg total) by mouth 4 (four) times daily. 20 capsule 0  . DULoxetine (CYMBALTA) 60 MG capsule Take 120 mg by mouth at bedtime.     . gabapentin (NEURONTIN) 300 MG capsule Take 300 mg by mouth 3 (three) times daily.    Marland Kitchen HYDROcodone-acetaminophen (NORCO/VICODIN) 5-325 MG tablet Take 1 tablet by  mouth every 6 (six) hours as needed for moderate pain. 10 tablet 0  . meloxicam (MOBIC) 7.5 MG tablet Take 15 mg by mouth daily.    . diazepam (VALIUM) 5 MG tablet Take 1 tablet (5 mg total) by mouth 2 (two) times daily. (Patient not taking: Reported on 12/06/2015) 10 tablet 0   No current facility-administered medications for this visit.    REVIEW OF SYSTEMS:  [X]  denotes positive finding, [ ]  denotes negative finding Cardiac  Comments:  Chest pain or chest pressure:    Shortness of breath upon exertion:    Short of breath when lying flat:    Irregular heart rhythm:        Vascular    Pain in calf, thigh, or hip brought on by ambulation: x   Pain in feet at night that wakes you up from your sleep:     Blood clot in your veins:    Leg swelling:         Pulmonary    Oxygen at home:    Productive cough:     Wheezing:         Neurologic    Sudden weakness in arms or legs:     Sudden numbness in arms or legs:     Sudden onset of difficulty speaking or slurred speech:    Temporary loss of vision in one eye:     Problems with dizziness:         Gastrointestinal    Blood in stool:     Vomited blood:         Genitourinary    Burning when urinating:     Blood in urine:        Psychiatric    Major depression:         Hematologic    Bleeding problems:    Problems with blood clotting too easily:        Skin    Rashes or ulcers:        Constitutional    Fever or chills:      PHYSICAL EXAM: Vitals:   09/23/19 1058  BP: 128/81  Pulse: 76  Resp: 18  Temp: 98.2 F (36.8 C)  TempSrc: Temporal  SpO2: 100%  Weight: (!) 210 lb 1.6 oz (95.3 kg)  Height: 5\' 6"  (1.676 m)    GENERAL: The patient is a well-nourished female, in no acute distress. The vital signs are documented above. CARDIAC: There is a regular rate and rhythm.  VASCULAR:  Palpable femoral pulses bilaterally Palpable right PT and left DP  PULMONARY: There is good air exchange bilaterally without wheezing  or rales. ABDOMEN: Soft and  non-tender with normal pitched bowel sounds.  MUSCULOSKELETAL: There are no major deformities or cyanosis. NEUROLOGIC: No focal weakness or paresthesias are detected. SKIN: There are no ulcers or rashes noted. PSYCHIATRIC: The patient has a normal affect.  DATA:   Noninvasive imaging today is normal with ABI of 1.14 on the right triphasic and 1.15 on the left triphasic with good pulsatile toe tracings.  Assessment/Plan:  54 year old female presents for evaluation of atherosclerosis and PAD in the setting of back pain with lower extremity radiculopathy and numbness in her feet.  Discussed with her in detail that she has normal noninvasive imaging with ABIs greater than 1 and normal triphasic waveforms at the ankle.  I can certainly appreciate a right posterior tibial and then a left dorsalis pedis that is palpable.  I do not think she is at any increased risk from a vascular standpoint at this time and has normal perfusion of her lower extremities.  Discussed that I do not think her symptoms are consistent with claudication or critical limb ischemia.  Offered follow-up as needed in the future.   Marty Heck, MD [facility] Office: [phone]

## 2019-10-22 ENCOUNTER — Ambulatory Visit
Admission: RE | Admit: 2019-10-22 | Discharge: 2019-10-22 | Disposition: A | Discharge status: Home / Self Care | Payer: Medicare HMO | Source: Ambulatory Visit | Attending: General Practice | Admitting: General Practice

## 2019-10-22 ENCOUNTER — Other Ambulatory Visit: Payer: Self-pay | Admitting: General Practice

## 2019-10-22 ENCOUNTER — Other Ambulatory Visit: Payer: Self-pay

## 2019-10-22 DIAGNOSIS — M25521 Pain in right elbow: Secondary | ICD-10-CM

## 2019-10-22 DIAGNOSIS — M25511 Pain in right shoulder: Secondary | ICD-10-CM

## 2019-12-15 IMAGING — MR MRI LUMBAR SPINE WITHOUT AND WITH CONTRAST
4 of 7 series · 20 of 48 positions shown · IV contrast (multihance)
Comparison: Lumbar spine radiographs 08/29/2018, lumbar CT
myelogram 08/13/2013, CT abdomen/pelvis 03/03/2016

CLINICAL DATA: Low back pain, unspecified back pain laterality,
unspecified chronicity, unspecified whether sciatica present. Acute
pain due to trauma.

EXAM:
MRI LUMBAR SPINE WITHOUT AND WITH CONTRAST
TECHNIQUE: Multiplanar and multiecho pulse sequences of the lumbar spine were
obtained without and with intravenous contrast.
CONTRAST:  20mL MULTIHANCE GADOBENATE DIMEGLUMINE 529 MG/ML IV SOLN

[Series 2: T1 · sagittal · 4.0mm · 0.88mm/px · 5 of 16 slices shown (1 of 2)]
[im 1/16]
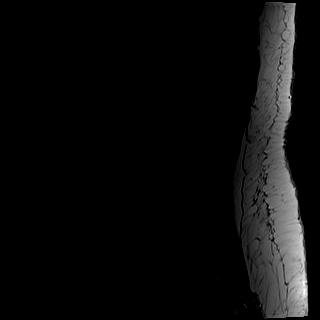
[im 4/16]
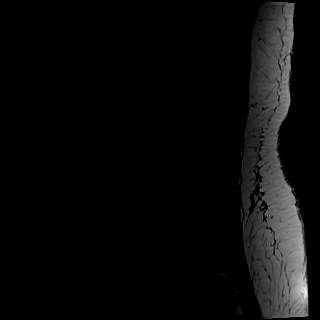
[im 8/16]
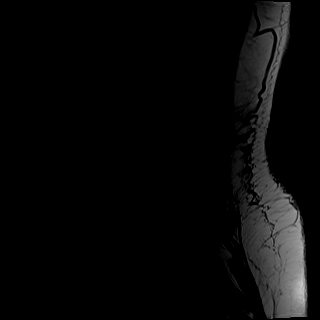
[im 12/16]
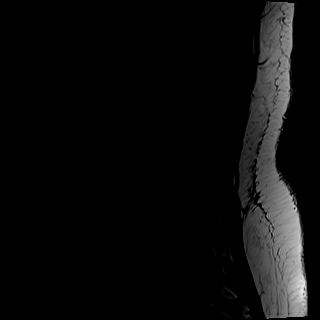
[im 16/16]
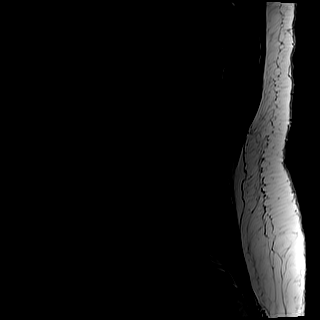

[Series 4: T2 · axial · 4.0mm · 0.39mm/px · z∈[-113,+95]mm · 8 of 40 slices shown (1 of 2)]
[im 1/40]
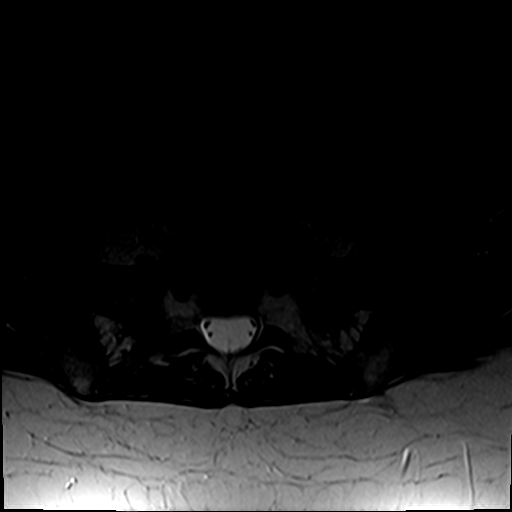
[im 5/40]
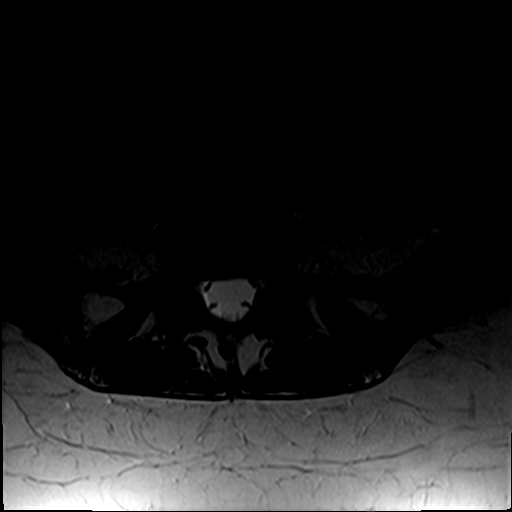
[im 14/40]
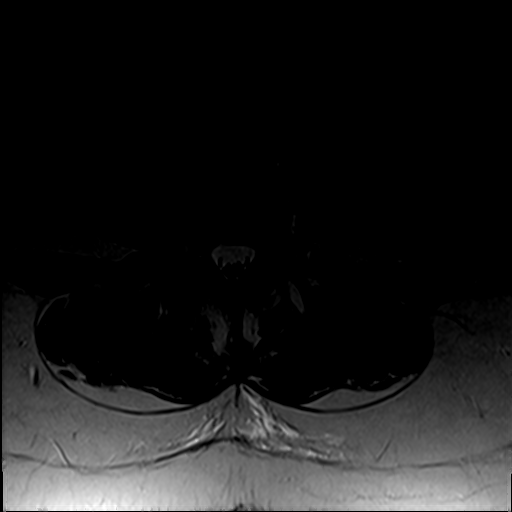
[im 18/40]
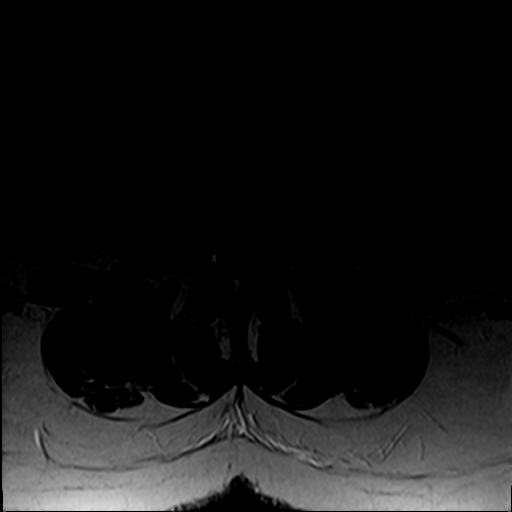
[im 22/40]
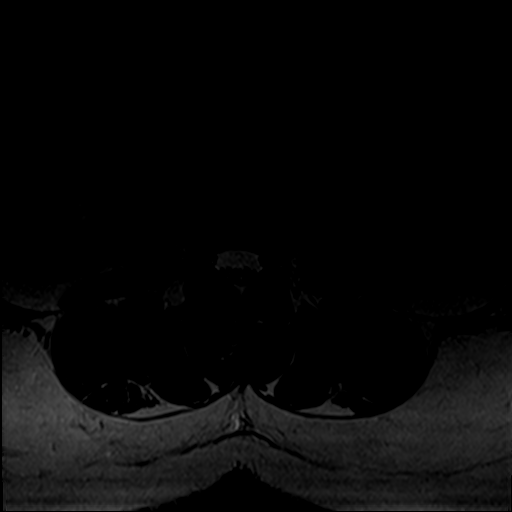
[im 27/40]
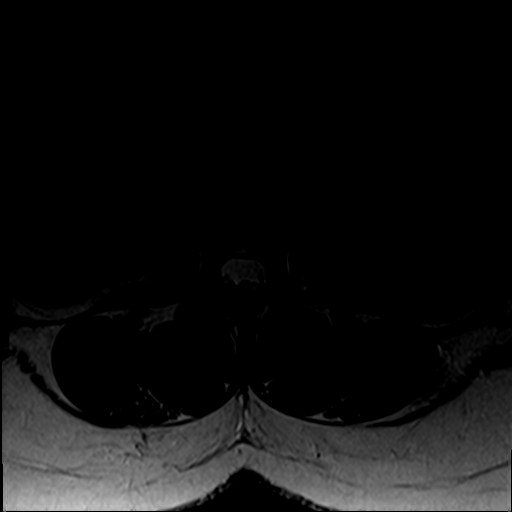
[im 35/40]
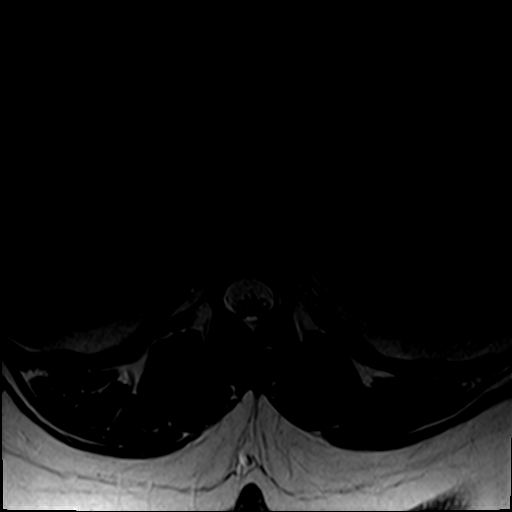
[im 40/40]
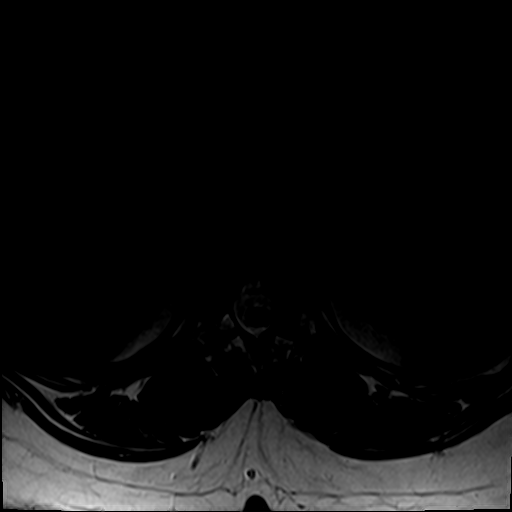

[Series 5: T1 · axial · 4.0mm · 0.31mm/px · z∈[-94,+71]mm · 3 of 40 slices shown (2 of 2)]
[im 5/40]
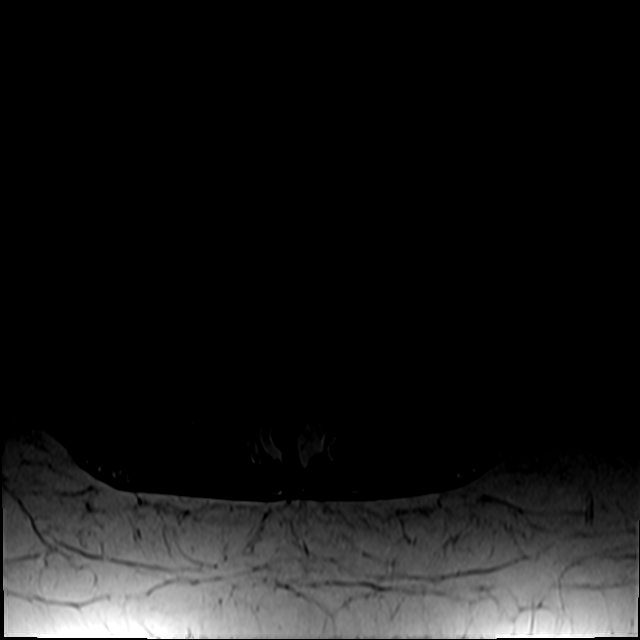
[im 22/40]
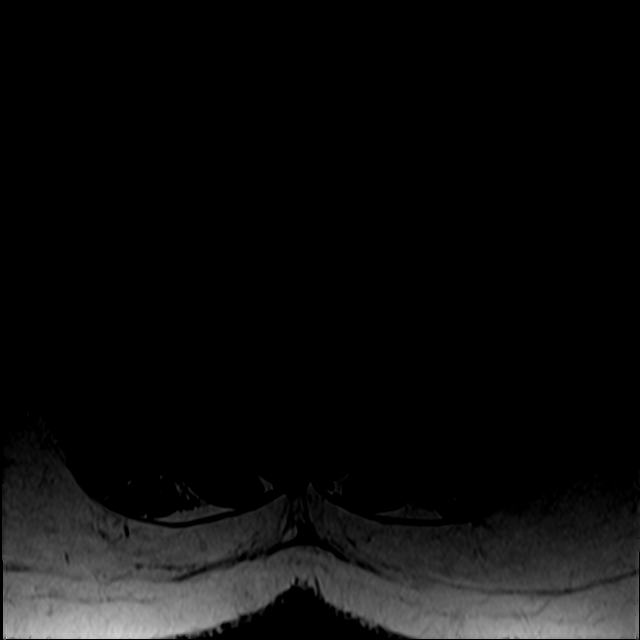
[im 35/40]
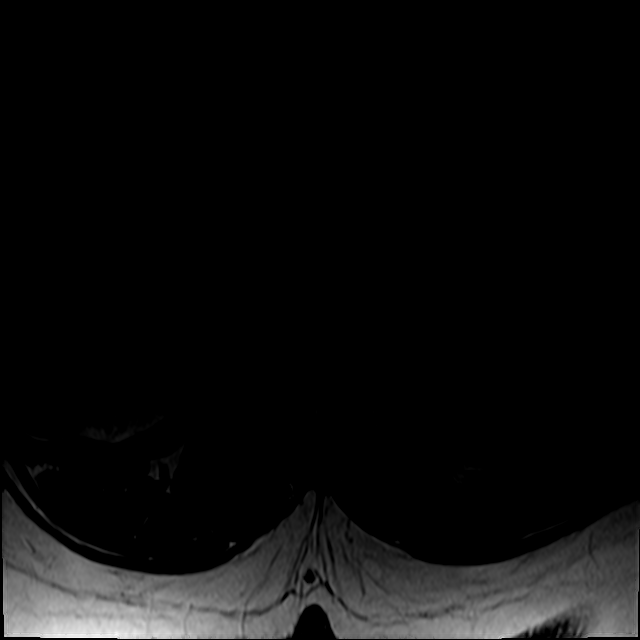

[Series 6: T2 · sagittal · 4.0mm · 0.62mm/px · 4 of 16 slices shown (2 of 2)]
[im 1/16]
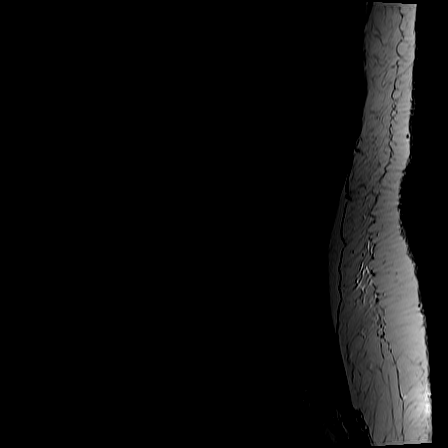
[im 6/16]
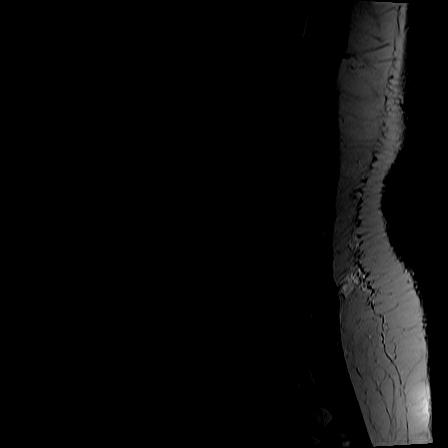
[im 11/16]
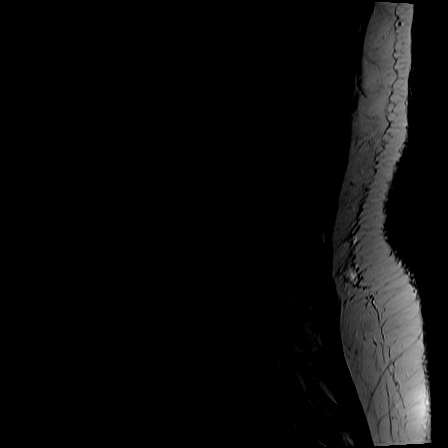
[im 16/16]
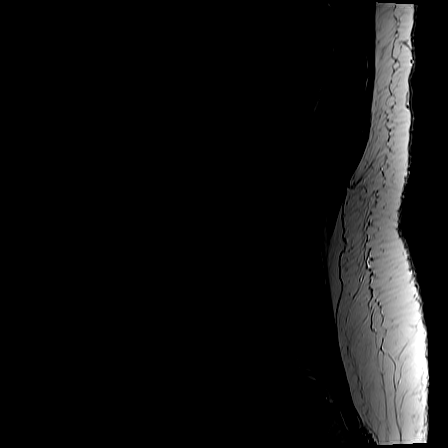

[20 of 48 positions shown; findings below may reference images not displayed]

FINDINGS: Segmentation: For the purposes of this dictation, five lumbar
vertebrae are assumed and the caudal most well-formed intervertebral
disc is designated L5-S1.

Alignment: No significant spondylolisthesis.

Vertebrae: Vertebral body height is maintained. Small T2/STIR
hyperintense enhancing focus within the posterior elements on the
right at T12, which appears to correspond with a vascular channel on
prior CT myelogram 08/13/2013. Mild degenerative enhancement along
the bilateral facet joints within the lower lumbar spine.

Conus medullaris and cauda equina: Conus extends to the L1-L2 level.
Conus and cauda equina appear normal.

Paraspinal and other soft tissues: Incompletely assessed bilateral
renal cysts.

Disc levels:

Mild multilevel disc degeneration within the lower lumbar spine,
greatest at L3-L4, and similar to prior CT myelogram.

At the T12-L1 through L2-L3 levels, no significant disc herniation,
spinal canal or neural foraminal narrowing.

L3-L4: A small disc bulge has progressed and there is a new shallow
left center to left foraminal disc protrusion. Facet arthrosis with
ligamentum flavum hypertrophy, progressed. There is now
mild/moderate central canal stenosis with asymmetric left
subarticular narrowing. New mild left neural foraminal narrowing.

L4-L5: Facet arthrosis with ligamentum flavum hypertrophy, slightly
progressed. No significant spinal canal or neural foraminal
narrowing.

L5-S1: Facet arthrosis, similar to prior exam. No significant disc
herniation, spinal canal or neural foraminal narrowing.
IMPRESSION: L3-L4 spondylosis has progressed since prior lumbar CT myelogram
08/13/2013. This includes a new shallow left center to left
foraminal disc protrusion contributing to mild/moderate central
canal stenosis with asymmetric left subarticular narrowing. New mild
left neural foraminal narrowing also present at this level.

L4-L5 facet arthrosis/ligamentum flavum hypertrophy has also
slightly progressed, without significant canal or neural foraminal
narrowing at this level.

Lumbar spondylosis is otherwise unchanged. No significant canal or
foraminal narrowing at the remaining levels.

## 2020-02-09 ENCOUNTER — Ambulatory Visit: Payer: Medicare HMO | Admitting: Podiatry

## 2020-02-09 ENCOUNTER — Other Ambulatory Visit: Payer: Self-pay

## 2020-02-09 ENCOUNTER — Ambulatory Visit: Payer: Self-pay

## 2020-02-09 ENCOUNTER — Ambulatory Visit (INDEPENDENT_AMBULATORY_CARE_PROVIDER_SITE_OTHER): Payer: Medicare HMO

## 2020-02-09 DIAGNOSIS — B353 Tinea pedis: Secondary | ICD-10-CM

## 2020-02-09 DIAGNOSIS — M2042 Other hammer toe(s) (acquired), left foot: Secondary | ICD-10-CM | POA: Diagnosis not present

## 2020-02-09 MED ORDER — CLOTRIMAZOLE-BETAMETHASONE 1-0.05 % EX CREA: 1.0000 "application " | TOPICAL_CREAM | Freq: Two times a day (BID) | CUTANEOUS | 0 refills | Status: AC

## 2020-02-09 NOTE — Patient Instructions (Signed)
Hammer Toe  Hammer toe is a change in the shape (a deformity) of your toe. The deformity causes the middle joint of your toe to stay bent. This causes pain, especially when you are wearing shoes. Hammer toe starts gradually. At first, the toe can be straightened. Gradually over time, the deformity becomes stiff and permanent. Early treatments to keep the toe straight may relieve pain. As the deformity becomes stiff and permanent, surgery may be needed to straighten the toe. What are the causes? Hammer toe is caused by abnormal bending of the toe joint that is closest to your foot. It happens gradually over time. This pulls on the muscles and connections (tendons) of the toe joint, making them weak and stiff. It is often related to wearing shoes that are too short or narrow and do not let your toes straighten. What increases the risk? You may be at greater risk for hammer toe if you:  Are female.  Are older.  Wear shoes that are too small.  Wear high-heeled shoes that pinch your toes.  Are a Engineer, mining.  Have a second toe that is longer than your big toe (first toe).  Injure your foot or toe.  Have arthritis.  Have a family history of hammer toe.  Have a nerve or muscle disorder. What are the signs or symptoms? The main symptoms of this condition are pain and deformity of the toe. The pain is worse when wearing shoes, walking, or running. Other symptoms may include:  Corns or calluses over the bent part of the toe or between the toes.  Redness and a burning feeling on the toe.  An open sore that forms on the top of the toe.  Not being able to straighten the toe. How is this diagnosed? This condition is diagnosed based on your symptoms and a physical exam. During the exam, your health care provider will try to straighten your toe to see how stiff the deformity is. You may also have tests, such as:  A blood test to check for rheumatoid arthritis.  An X-ray to show how  severe the deformity is. How is this treated? Treatment for this condition will depend on how stiff the deformity is. Surgery is often needed. However, sometimes a hammer toe can be straightened without surgery. Treatments that do not involve surgery include:  Taping the toe into a straightened position.  Using pads and cushions to protect the toe (orthotics).  Wearing shoes that provide enough room for the toes.  Doing toe-stretching exercises at home.  Taking an NSAID to reduce pain and swelling. If these treatments do not help or the toe cannot be straightened, surgery is the next option. The most common surgeries used to straighten a hammer toe include:  Arthroplasty. In this procedure, part of the joint is removed, and that allows the toe to straighten.  Fusion. In this procedure, cartilage between the two bones of the joint is taken out and the bones are fused together into one longer bone.  Implantation. In this procedure, part of the bone is removed and replaced with an implant to let the toe move again.  Flexor tendon transfer. In this procedure, the tendons that curl the toes down (flexor tendons) are repositioned. Follow these instructions at home:  Take over-the-counter and prescription medicines only as told by your health care provider.  Do toe straightening and stretching exercises as told by your health care provider.  Keep all follow-up visits as told by your health care  provider. This is important. How is this prevented?  Wear shoes that give your toes enough room and do not cause pain.  Do not wear high-heeled shoes. Contact a health care provider if:  Your pain gets worse.  Your toe becomes red or swollen.  You develop an open sore on your toe. This information is not intended to replace advice given to you by your health care provider. Make sure you discuss any questions you have with your health care provider. Document Revised: 01/26/2017 Document  Reviewed: 06/09/2015 Elsevier Patient Education  2020 [employer].

## 2020-02-16 NOTE — Progress Notes (Signed)
Subjective:   Patient ID: Flossie Buffy, female   DOB: 54 y.o.   MRN: [id number]   HPI 54 year old female presents the office with concerns of pain to her left fourth and fifth toes.  She states that she feels that the skin is splitting underneath the toes and is becoming painful all over the last 2 months.  She has tried creams in between her toes which did not help.  She states it gets itchy at times but currently not.  Denies any swelling or redness.  She has no other concerns.   Review of Systems  All other systems reviewed and are negative.  Past Medical History:  Diagnosis Date  . Allergic rhinitis, cause unspecified   . Anemia 08-18-11   iron deficiency.  . Blood transfusion   . Depression   . Hyperlipidemia   . Neuromuscular disorder ([place]) 08-18-11   left  shoulder chronic pain,remains being evaluated-Dr. Nelva Bush  . Other chronic allergic conjunctivitis   . Transfusion history 08-18-11   x5 yrs ago-low blood count    Past Surgical History:  Procedure Laterality Date  . ABDOMINAL HYSTERECTOMY  08-18-11   Vaginal Hysterectomy  . HAND SURGERY  08-18-11   2009-Bil. reains with some pain and numbness(wrist and palm)  . NECK SURGERY  08-18-11   2009-DrRolena Infante- Fusion cervical with retained hardware.ROM causes some pain still.  Marland Kitchen SHOULDER SURGERY  2007   both  . UMBILICAL HERNIA REPAIR  08/25/2011   Procedure: HERNIA REPAIR UMBILICAL ADULT;  Surgeon: Harl Bowie, MD;  Location: [facility];  Service: General;  Laterality: N/A;     Current Outpatient Medications:  .  ARIPiprazole (ABILIFY) 5 MG tablet, Take 5 mg by mouth daily., Disp: , Rfl:  .  cephALEXin (KEFLEX) 500 MG capsule, Take 1 capsule (500 mg total) by mouth 4 (four) times daily., Disp: 20 capsule, Rfl: 0 .  clotrimazole-betamethasone (LOTRISONE) cream, Apply 1 application topically 2 (two) times daily., Disp: 30 g, Rfl: 0 .  diazepam (VALIUM) 5 MG tablet, Take 1 tablet (5 mg total) by mouth 2 (two) times daily.  (Patient not taking: Reported on 12/06/2015), Disp: 10 tablet, Rfl: 0 .  DULoxetine (CYMBALTA) 60 MG capsule, Take 120 mg by mouth at bedtime. , Disp: , Rfl:  .  gabapentin (NEURONTIN) 300 MG capsule, Take 300 mg by mouth 3 (three) times daily., Disp: , Rfl:  .  HYDROcodone-acetaminophen (NORCO/VICODIN) 5-325 MG tablet, Take 1 tablet by mouth every 6 (six) hours as needed for moderate pain., Disp: 10 tablet, Rfl: 0 .  meloxicam (MOBIC) 7.5 MG tablet, Take 15 mg by mouth daily., Disp: , Rfl:   Allergies  Allergen Reactions  . Ibuprofen     Stomach upset.          Objective:  Physical Exam  General: AAO x3, NAD  Dermatological: Today there is no area of skin breakdown there is no evidence of tinea pedis.  There is no macerated skin present.  There is no blister formation.  There is no skin fissures.  No open lesions.  Vascular: Dorsalis Pedis artery and Posterior Tibial artery pedal pulses are 2/4 bilateral with immedate capillary fill time.  There is no pain with calf compression, swelling, warmth, erythema.   Neruologic: Grossly intact via light touch bilateral.   Musculoskeletal: Adductovarus, hammertoe deformities present to the lesser digits.  There is tenderness palpation on trying to put the toes in rectus position of the left fourth and fifth toes.  MMT  5/5.  Gait: Unassisted, Nonantalgic.       Assessment:   54 year old female with symptomatic hammertoes, ? Tinea pedis     Plan:  -Treatment options discussed including all alternatives, risks, and complications -Etiology of symptoms were discussed -X-rays were obtained and reviewed with the patient.  Hammertoes are evident but there is no evidence of acute fracture. -Patient asymptomatic if multifactorial.  I do feel that she previously had some tinea pedis and I prescribed Lotrisone to use.  Also dispensed toe crest to help with the hammertoe contractures.  Discussed shoe modifications as well.  Trula Slade  DPM

## 2020-04-08 ENCOUNTER — Other Ambulatory Visit: Payer: Self-pay | Admitting: General Practice

## 2020-04-08 ENCOUNTER — Ambulatory Visit
Admission: RE | Admit: 2020-04-08 | Discharge: 2020-04-08 | Disposition: A | Discharge status: Home / Self Care | Payer: Medicare HMO | Source: Ambulatory Visit | Attending: General Practice | Admitting: General Practice

## 2020-04-08 ENCOUNTER — Other Ambulatory Visit: Payer: Self-pay

## 2020-04-08 DIAGNOSIS — R059 Cough, unspecified: Secondary | ICD-10-CM

## 2020-09-16 ENCOUNTER — Other Ambulatory Visit: Payer: Self-pay

## 2020-09-16 ENCOUNTER — Ambulatory Visit (HOSPITAL_COMMUNITY)
Admission: EM | Admit: 2020-09-16 | Discharge: 2020-09-16 | Disposition: A | Discharge status: Home / Self Care | Payer: Medicare Other

## 2020-09-16 ENCOUNTER — Encounter (HOSPITAL_COMMUNITY): Payer: Self-pay | Admitting: Emergency Medicine

## 2020-09-16 DIAGNOSIS — M5442 Lumbago with sciatica, left side: Secondary | ICD-10-CM | POA: Diagnosis not present

## 2020-09-16 DIAGNOSIS — M5441 Lumbago with sciatica, right side: Secondary | ICD-10-CM | POA: Diagnosis not present

## 2020-09-16 MED ORDER — MELOXICAM 7.5 MG PO TABS: 15.0000 mg | ORAL_TABLET | Freq: Every day | ORAL | 0 refills | Status: DC
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
## 2020-09-16 NOTE — Discharge Instructions (Addendum)
Use meloxicam every morning with food for 5 days then as needed  Heating pad 15 minute intervals, warm soaks if tolerated  Gentle stretching as tolerated  Ortho follow up in 2 weeks if pain persist

## 2021-01-08 IMAGING — CR DG SHOULDER 2+V*R*
3 series · 3 of 3 positions shown · non-contrast
Comparison: None.

CLINICAL DATA: Shoulder pain

EXAM:
RIGHT SHOULDER - 2+ VIEW

[w shoulder grashey right]
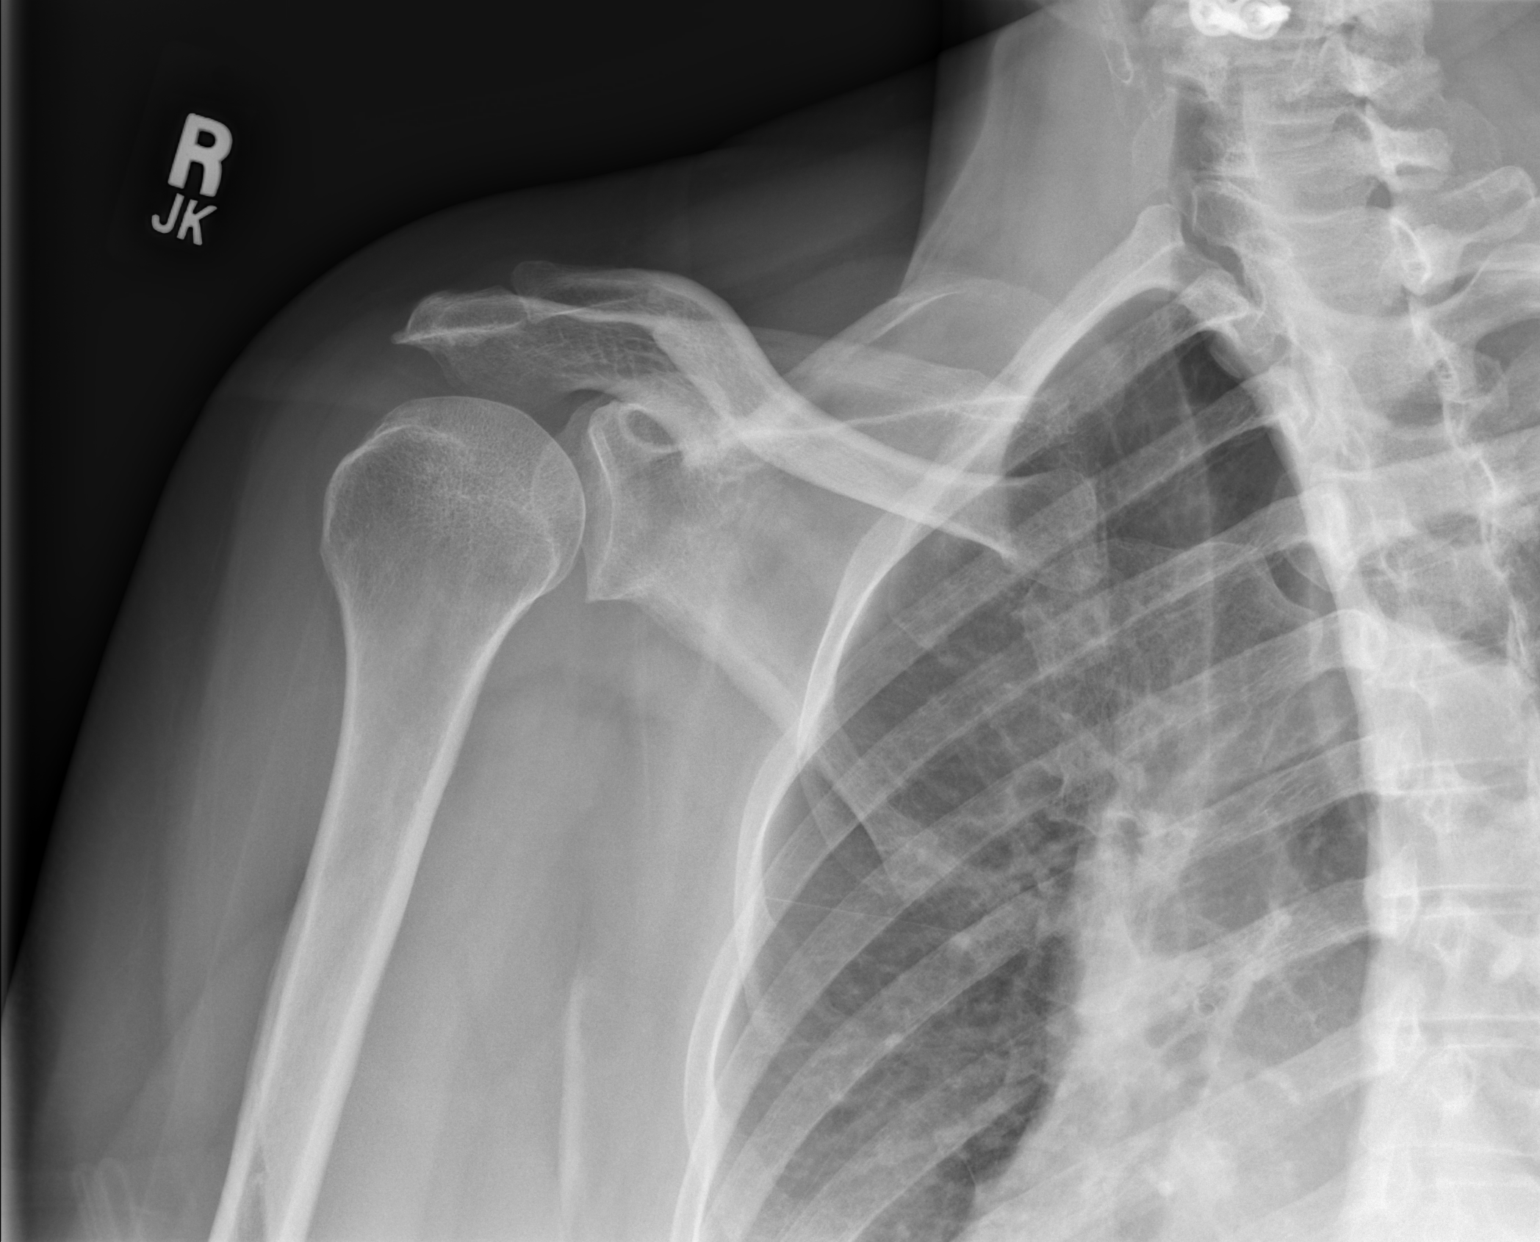

[w shoulder y-view right]
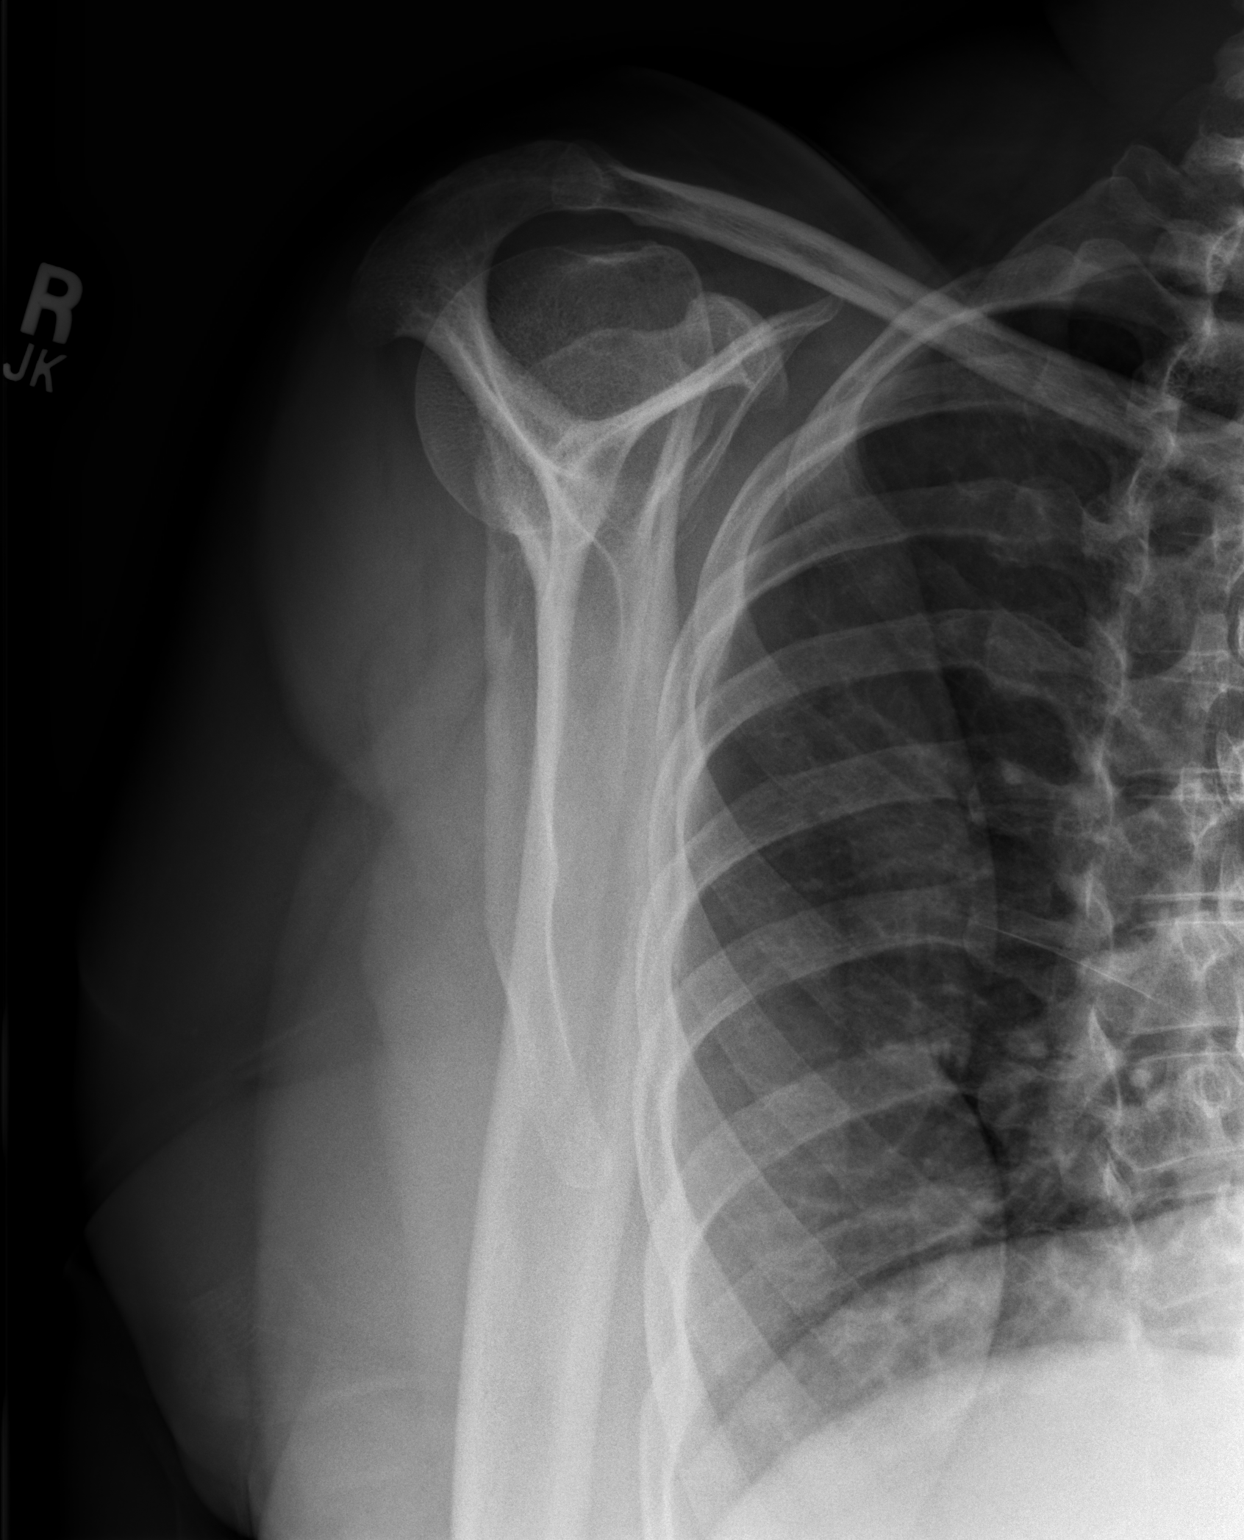

[w shoulder axillary right]
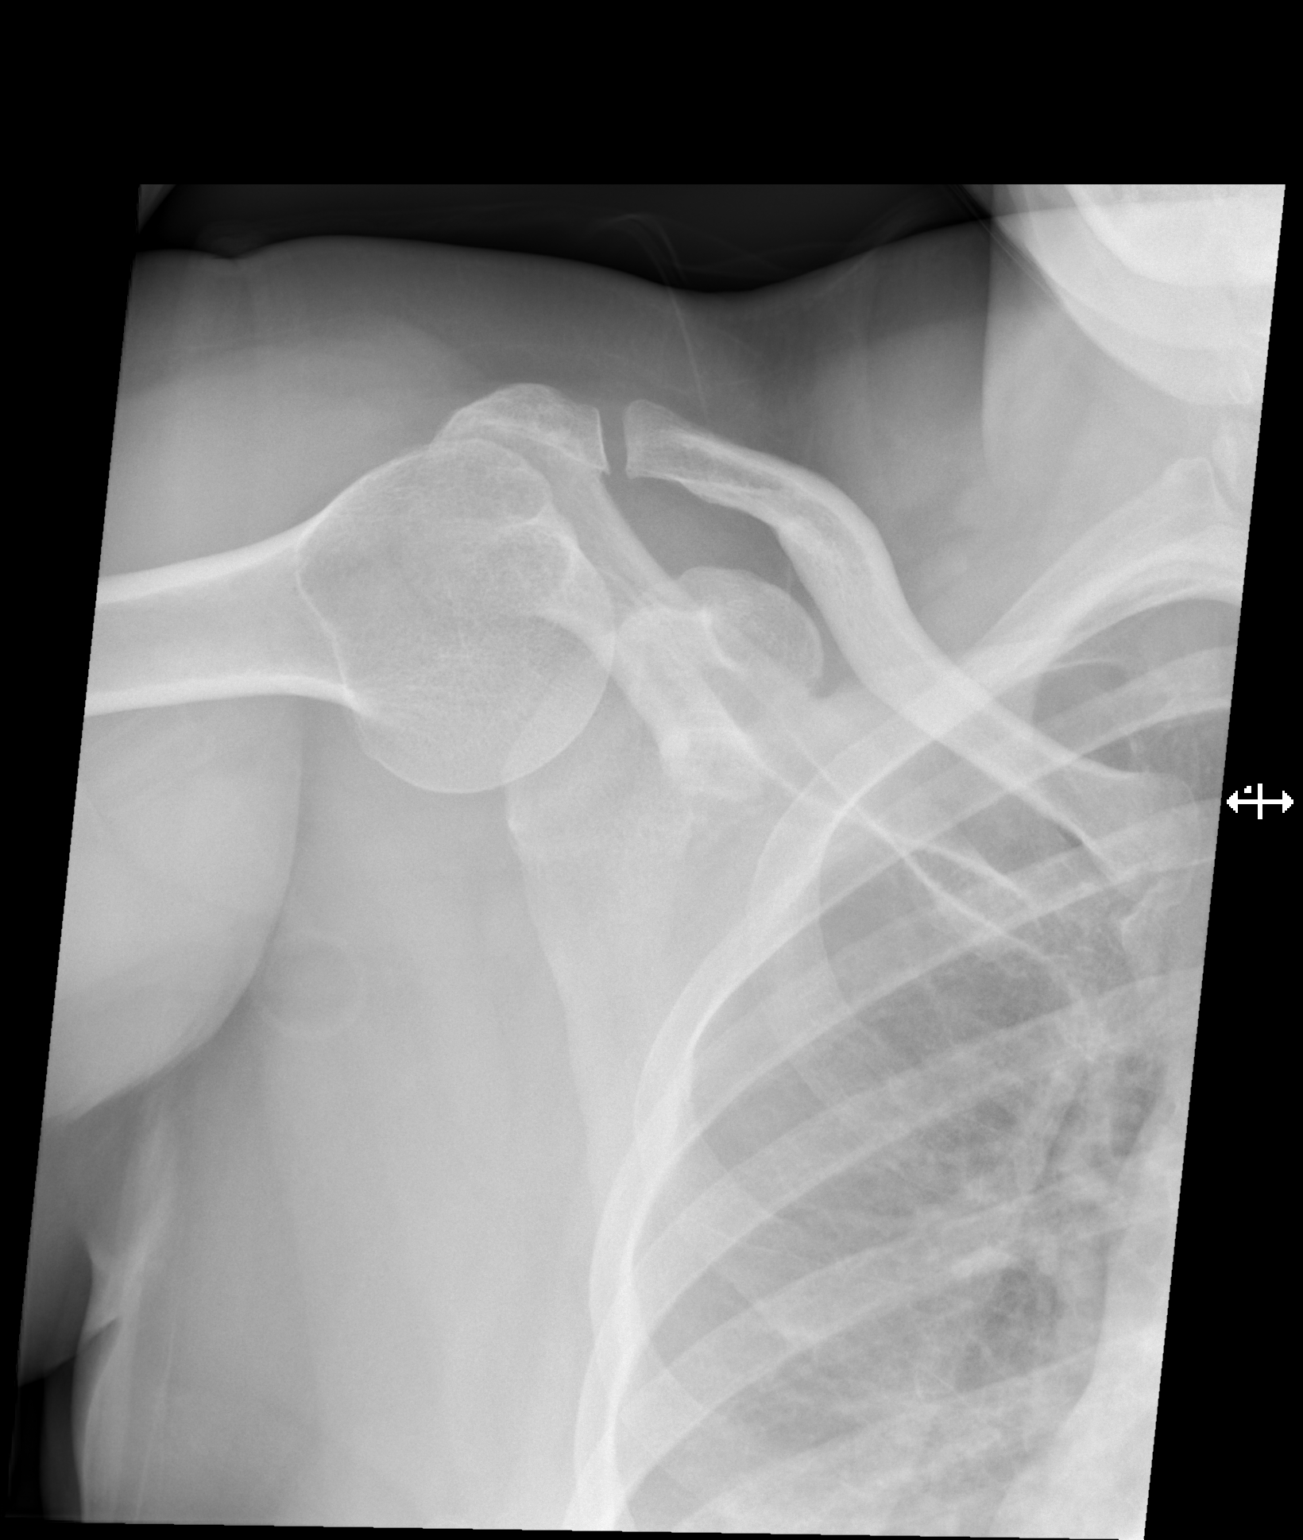

[3 of 3 positions shown; findings below may reference images not displayed]

FINDINGS: No fracture or malalignment. Mild AC joint degenerative change. Mild
inferior glenohumeral degenerative change. The right lung apex is
clear
IMPRESSION: Mild degenerative changes.

## 2021-04-28 ENCOUNTER — Other Ambulatory Visit: Payer: Self-pay

## 2021-04-28 ENCOUNTER — Ambulatory Visit (INDEPENDENT_AMBULATORY_CARE_PROVIDER_SITE_OTHER): Payer: Medicare (Managed Care)

## 2021-04-28 ENCOUNTER — Ambulatory Visit (INDEPENDENT_AMBULATORY_CARE_PROVIDER_SITE_OTHER): Payer: Medicare (Managed Care) | Admitting: Podiatry

## 2021-04-28 DIAGNOSIS — M778 Other enthesopathies, not elsewhere classified: Secondary | ICD-10-CM

## 2021-04-28 DIAGNOSIS — M2042 Other hammer toe(s) (acquired), left foot: Secondary | ICD-10-CM

## 2021-04-28 DIAGNOSIS — D3610 Benign neoplasm of peripheral nerves and autonomic nervous system, unspecified: Secondary | ICD-10-CM

## 2021-04-28 MED ORDER — METHYLPREDNISOLONE 4 MG PO TBPK: ORAL_TABLET | ORAL | 0 refills | Status: DC

## 2021-05-02 NOTE — Progress Notes (Signed)
Subjective: ?56 year old female presents the office today for recurrence of pain to the bottom of her left foot submetatarsal in the ball of her foot.  She said the area is tender to touch.  No recent injuries that she reports.  She denies any open sores.  The area is tender with pressure and weightbearing.  No radiating pain. ? ?Objective: ?AAO x3, NAD ?DP/PT pulses palpable bilaterally, CRT less than 3 seconds ?Tenderness palpation on the third interspace as well as submetatarsal 4, 5.  There is no specific area pinpoint tenderness noted today.  She does describe some sharp pain into the toes.  No obvious palpable neuroma identified at this time.  No pain with calf compression, swelling, warmth, erythema ? ?Assessment: ?Capsulitis, possible neuroma ? ?Plan: ?-All treatment options discussed with the patient including all alternatives, risks, complications.  ?-X-rays obtained and reviewed of the left foot.  No definitive evidence of acute fracture noted today. ?-Prescribed Medrol Dosepak ?-Gel metatarsal pad ?-Supportive shoe gear ?-If symptoms continue consider MRI ?-Patient encouraged to call the office with any questions, concerns, change in symptoms.  ? ?Trula Slade DPM ? ? ?

## 2021-05-10 ENCOUNTER — Other Ambulatory Visit: Payer: Self-pay | Admitting: Registered Nurse

## 2021-05-19 ENCOUNTER — Other Ambulatory Visit: Payer: Self-pay | Admitting: Registered Nurse

## 2021-05-20 ENCOUNTER — Other Ambulatory Visit: Payer: Self-pay | Admitting: Registered Nurse

## 2021-05-20 DIAGNOSIS — L729 Follicular cyst of the skin and subcutaneous tissue, unspecified: Secondary | ICD-10-CM

## 2021-05-23 ENCOUNTER — Other Ambulatory Visit (HOSPITAL_COMMUNITY): Payer: Self-pay | Admitting: Registered Nurse

## 2021-05-27 ENCOUNTER — Other Ambulatory Visit: Payer: Self-pay | Admitting: Podiatry

## 2021-05-27 DIAGNOSIS — M778 Other enthesopathies, not elsewhere classified: Secondary | ICD-10-CM

## 2021-05-31 ENCOUNTER — Other Ambulatory Visit: Payer: Self-pay | Admitting: Registered Nurse

## 2021-05-31 DIAGNOSIS — N644 Mastodynia: Secondary | ICD-10-CM

## 2021-05-31 DIAGNOSIS — L729 Follicular cyst of the skin and subcutaneous tissue, unspecified: Secondary | ICD-10-CM

## 2021-06-08 ENCOUNTER — Telehealth: Payer: Self-pay | Admitting: Podiatry

## 2021-06-08 ENCOUNTER — Other Ambulatory Visit: Payer: Self-pay | Admitting: Podiatry

## 2021-06-08 DIAGNOSIS — D3610 Benign neoplasm of peripheral nerves and autonomic nervous system, unspecified: Secondary | ICD-10-CM

## 2021-06-08 DIAGNOSIS — M778 Other enthesopathies, not elsewhere classified: Secondary | ICD-10-CM

## 2021-06-08 NOTE — Telephone Encounter (Signed)
Pt called stating the medication and pad for her hammertoe isn't working. She wanted to notify you so she could have an idea on next steps in treatment. Please advise .  ?

## 2021-06-08 NOTE — Telephone Encounter (Signed)
Lvm to notify patient

## 2021-06-29 ENCOUNTER — Other Ambulatory Visit: Payer: Medicare Other

## 2021-07-03 ENCOUNTER — Ambulatory Visit
Admission: RE | Admit: 2021-07-03 | Discharge: 2021-07-03 | Disposition: A | Discharge status: Home / Self Care | Payer: Medicare (Managed Care) | Source: Ambulatory Visit | Attending: Podiatry | Admitting: Podiatry

## 2021-07-03 DIAGNOSIS — M778 Other enthesopathies, not elsewhere classified: Secondary | ICD-10-CM

## 2021-07-03 DIAGNOSIS — D3610 Benign neoplasm of peripheral nerves and autonomic nervous system, unspecified: Secondary | ICD-10-CM

## 2021-07-11 ENCOUNTER — Telehealth: Payer: Self-pay | Admitting: Podiatry

## 2021-07-11 ENCOUNTER — Ambulatory Visit (INDEPENDENT_AMBULATORY_CARE_PROVIDER_SITE_OTHER): Payer: Medicare (Managed Care) | Admitting: Podiatry

## 2021-07-11 DIAGNOSIS — D3610 Benign neoplasm of peripheral nerves and autonomic nervous system, unspecified: Secondary | ICD-10-CM

## 2021-07-11 DIAGNOSIS — M778 Other enthesopathies, not elsewhere classified: Secondary | ICD-10-CM | POA: Diagnosis not present

## 2021-07-11 MED ORDER — CELECOXIB 100 MG PO CAPS: 100.0000 mg | ORAL_CAPSULE | Freq: Two times a day (BID) | ORAL | 0 refills | Status: DC

## 2021-07-11 NOTE — Telephone Encounter (Signed)
Patient is returning a call to [facility] from Friday

## 2021-07-13 NOTE — Progress Notes (Signed)
Subjective: ?56 year old female presents also for ongoing pain to her left foot.  She had an MRI performed she presents today for further evaluation.  No recent injury or changes otherwise since I last saw her. ? ?Objective: ?AAO x3, NAD ?DP/PT pulses palpable bilaterally, CRT less than 3 seconds ?Joint tenderness on the third interspace as well as submetatarsal 4 and 5 with the majority of today's on the third interspace.  There is no area pinpoint tenderness but there is no edema, erythema. ?No pain with calf compression, swelling, warmth, erythema ? ?Assessment: ?Capsulitis, still concern for possible small neuroma ? ?Plan: ?-All treatment options discussed with the patient including all alternatives, risks, complications.  ?-MRI was reviewed ?-I discussed steroid injection today along the interspace.  We also discussed changing shoes and good arch support.  She is in a work on the arch support.  Prescribed Celebrex.  Hold off on other anti-inflammatories.  If no improvement likely proceed with steroid injection. ?-Patient encouraged to call the office with any questions, concerns, change in symptoms.  ? ?Trula Slade DPM ? ?

## 2021-07-19 ENCOUNTER — Other Ambulatory Visit: Payer: Self-pay | Admitting: Registered Nurse

## 2021-07-19 DIAGNOSIS — L729 Follicular cyst of the skin and subcutaneous tissue, unspecified: Secondary | ICD-10-CM

## 2021-07-23 ENCOUNTER — Other Ambulatory Visit: Payer: Self-pay | Admitting: Podiatry

## 2021-07-26 ENCOUNTER — Other Ambulatory Visit: Payer: Medicare (Managed Care)

## 2021-08-08 ENCOUNTER — Other Ambulatory Visit: Payer: Self-pay | Admitting: Podiatry

## 2021-08-08 NOTE — Telephone Encounter (Signed)
Please advise 

## 2021-08-11 ENCOUNTER — Other Ambulatory Visit: Payer: Medicare (Managed Care)

## 2021-08-23 ENCOUNTER — Other Ambulatory Visit: Payer: Self-pay | Admitting: Podiatry

## 2021-08-24 NOTE — Telephone Encounter (Signed)
This was ordered last week I believe so she should not need a refill.

## 2021-09-07 ENCOUNTER — Ambulatory Visit
Admission: RE | Admit: 2021-09-07 | Discharge: 2021-09-07 | Disposition: A | Discharge status: Home / Self Care | Payer: Medicare HMO | Source: Ambulatory Visit | Attending: Registered Nurse | Admitting: Registered Nurse

## 2021-09-07 DIAGNOSIS — L729 Follicular cyst of the skin and subcutaneous tissue, unspecified: Secondary | ICD-10-CM

## 2021-09-27 ENCOUNTER — Other Ambulatory Visit: Payer: Self-pay | Admitting: Podiatry

## 2021-10-07 ENCOUNTER — Other Ambulatory Visit: Payer: Self-pay | Admitting: Family

## 2021-10-07 DIAGNOSIS — Z1231 Encounter for screening mammogram for malignant neoplasm of breast: Secondary | ICD-10-CM

## 2021-11-11 ENCOUNTER — Ambulatory Visit: Payer: Medicare HMO | Admitting: Podiatry

## 2021-11-11 DIAGNOSIS — M778 Other enthesopathies, not elsewhere classified: Secondary | ICD-10-CM | POA: Diagnosis not present

## 2021-11-11 DIAGNOSIS — M2142 Flat foot [pes planus] (acquired), left foot: Secondary | ICD-10-CM

## 2021-11-11 DIAGNOSIS — M2141 Flat foot [pes planus] (acquired), right foot: Secondary | ICD-10-CM

## 2021-11-11 DIAGNOSIS — L84 Corns and callosities: Secondary | ICD-10-CM | POA: Diagnosis not present

## 2021-11-11 NOTE — Patient Instructions (Signed)
I would keep moisturizer on the calluses daily. You and look for one that has "urea" in it to help.   While at your visit today you received a steroid injection in your foot or ankle to help with your pain. Along with having the steroid medication there is some "numbing" medication in the shot that you received. Due to this you may notice some numbness to the area for the next couple of hours.   I would recommend limiting activity for the next few days to help the steroid injection take affect.    The actually benefit from the steroid injection may take up to 2-7 days to see a difference. You may actually experience a small (as in 10%) INCREASE in pain in the first 24 hours---that is common. It would be best if you can ice the area today and take anti-inflammatory medications (such as Ibuprofen, Motrin, or Aleve) if you are able to take these medications. If you were prescribed another medication to help with the pain go ahead and start that medication today    Things to watch out for that you should contact us or a health care provider urgently would include: 1. Unusual (as in more than 10%) increase in pain 2. New fever > 101.5 3. New swelling or redness of the injected area.  4. Streaking of red lines around the area injected.  If you have any questions or concerns about this, please give our office a call at [phone].

## 2021-11-11 NOTE — Progress Notes (Unsigned)
Subjective: Chief Complaint  Patient presents with   Foot Pain    Capsulitis on the left foot, still having pain, Rate of pain 9 out of 10, TX: would like to try the injection today    56 year old female with above concerns.  She is requesting an injection today.  She has no numbness or tingling.  She said the pain is there most the time but does vary in severity.  She notes a little bit of swelling.  She also likes to wear flat shoes and she has calluses on both feet.  No injury or changes otherwise. No numbness/tingling. Pain is there most of the time. She has noticed a little swelling to the forefoot.   Objective: AAO x3, NAD DP/PT pulses palpable bilaterally, CRT less than 3 seconds Callus right and left medial hallux and submetatarsal 1 without any underlying ulceration drainage or signs of any Tenderness mostly noted along the third interspace of the left foot but also to the second interspace.  There is trace edema there is no area no area pinpoint tenderness. No other areas of discomfort.  MMT 5/5.  Left foot present No pain with calf compression, swelling, warmth, erythema  Assessment: 56 year old female with capsulitis left foot, hyperkeratotic lesions, flatfoot  Plan: -All treatment options discussed with the patient including all alternatives, risks, complications.  -Injection left 3rd intspace . Mixture 1 cc Kenalog 10, 0.5 cc of Marcaine plain, 0.5 cc of lidocaine plain was infiltrated into the third interspace without complications.  Postinjection care discussed.  Tolerated well. -Discussed offloading pads and discussed wearing shoes and better arch support as opposed to the flat shoes that she typically wears. -The calluses as a courtesy with any complications or bleeding.  Moisturizer as well as supportive shoe gear as well. -Patient encouraged to call the office with any questions, concerns, change in symptoms.   Trula Slade DPM

## 2022-03-10 ENCOUNTER — Encounter: Payer: Self-pay | Admitting: Podiatry

## 2022-03-10 ENCOUNTER — Ambulatory Visit: Payer: Medicare HMO | Admitting: Podiatry

## 2022-03-10 DIAGNOSIS — D3610 Benign neoplasm of peripheral nerves and autonomic nervous system, unspecified: Secondary | ICD-10-CM

## 2022-03-10 DIAGNOSIS — M7752 Other enthesopathy of left foot: Secondary | ICD-10-CM

## 2022-03-10 MED ORDER — TRIAMCINOLONE ACETONIDE 10 MG/ML IJ SUSP
10.0000 mg | Freq: Once | INTRAMUSCULAR | Status: AC
  Administered 2022-03-10: 10 mg

## 2022-03-10 NOTE — Progress Notes (Signed)
Subjective:   Patient ID: Lisa [name], female   DOB: 57 y.o.   MRN: [id number]   HPI Patient presents stating that it is worse around the third joint of the left and also into the third interspace stating the previous injection did give her relief for about 3 months   ROS      Objective:  Physical Exam  Neurovascular status intact muscle strength adequate inflammation fluid around the third MPJ left with pain and into the third interspace     Assessment:  Inflammatory capsulitis of the third MPJ left and into the third interspace left     Plan:  I recommend continued conservative care sterile prep and injected around the third MPJ 3 mg Dexasone Kenalog 5 mg Xylocaine and into the interspace.  Gave instructions on wider shoes ice therapy reappoint to recheck

## 2022-04-25 ENCOUNTER — Ambulatory Visit (INDEPENDENT_AMBULATORY_CARE_PROVIDER_SITE_OTHER): Payer: Medicare HMO | Admitting: Podiatry

## 2022-04-25 DIAGNOSIS — M7752 Other enthesopathy of left foot: Secondary | ICD-10-CM

## 2022-04-25 MED ORDER — TRIAMCINOLONE ACETONIDE 10 MG/ML IJ SUSP
10.0000 mg | Freq: Once | INTRAMUSCULAR | Status: AC
  Administered 2022-04-25: 10 mg

## 2022-04-25 NOTE — Progress Notes (Signed)
Subjective:   Patient ID: Lisa [name], female   DOB: 57 y.o.   MRN: [id number]   HPI Chief Complaint  Patient presents with   capsulitis    Rm 12 Follow up left foot pain. Pa still located at the ball of her foot near toes 2-4. Pain with edema and difficult to bear weight.     57 year old female presents the office today with above concerns.  She states that she is getting pain in the ball of the foot near the toes 2 through 4 she gets occasional swelling and hurts to walk at times.  She states the previous injection helped about 2 weeks.  Oral steroids did not help previously.  No recent injuries or changes otherwise.        Objective:  Physical Exam  General: AAO x3, NAD  Dermatological: Skin is warm, dry and supple bilateral.  There are no open sores, no preulcerative lesions, no rash or signs of infection present.  Vascular: Dorsalis Pedis artery and Posterior Tibial artery pedal pulses are 2/4 bilateral with immedate capillary fill time.There is no pain with calf compression, swelling, warmth, erythema.   Neruologic: Grossly intact via light touch bilateral  Musculoskeletal: Majority tenderness is localized along third interspace of the left foot.  Trace edema is no erythema or warmth.  No palpable neuroma noted today and there is no area pinpoint tenderness.  Flexor, extensor tendons appear to be intact.  Muscular strength 5/5 in all groups tested bilateral.  Gait: Unassisted, Nonantalgic.      Assessment:   Inflammatory capsulitis of the third MPJ left and into the third interspace left     Plan:  -We discussed the conservative as well as surgical options.   -Today repeat steroid injection as part of the third interspace.  Skin was cleaned with Betadine, alcohol and a mixture of 1 cc Kenalog 10, 0.5 cc of Marcaine plain, 0.5 cc of lidocaine plain was infiltrated into around the third interspace, MPJ without complications.  Postinjection care discussed. -Will check  insurance coverage for orthotics. -Discussed supportive shoe gear. - Previously had MRI in May  2023.  If no improvement consider repeat MRI.  Trula Slade DPM

## 2022-04-25 NOTE — Patient Instructions (Signed)

## 2022-05-02 ENCOUNTER — Telehealth: Payer: Self-pay | Admitting: Podiatry

## 2022-05-02 NOTE — Telephone Encounter (Signed)
Faxed Predetermination to [facility] for orthotics    Denied due to requirements not met ref number [id number]

## 2022-05-12 ENCOUNTER — Telehealth: Payer: Self-pay | Admitting: Podiatry

## 2022-05-12 NOTE — Telephone Encounter (Signed)
[employer] insurance called and stated the needed to schedule a peer to peer for predetermination for orthotics. They will give the office a call back when a decision has been made to set up an appointment.  Claim Number [id number] [facility] [phone]

## 2022-06-27 ENCOUNTER — Ambulatory Visit: Payer: Medicare HMO | Admitting: Podiatry

## 2022-06-27 ENCOUNTER — Ambulatory Visit: Payer: Medicare HMO

## 2022-06-27 DIAGNOSIS — M778 Other enthesopathies, not elsewhere classified: Secondary | ICD-10-CM

## 2022-06-27 DIAGNOSIS — D3610 Benign neoplasm of peripheral nerves and autonomic nervous system, unspecified: Secondary | ICD-10-CM | POA: Diagnosis not present

## 2022-06-27 MED ORDER — METHYLPREDNISOLONE 4 MG PO TBPK: ORAL_TABLET | ORAL | 0 refills | Status: AC

## 2022-06-27 NOTE — Patient Instructions (Signed)
I have ordered a ultrasound of the left foot for [facility]. If you do not hear for them about scheduling within the next 1 week, or you have any questions please give [place] a call at [phone].   --   WEARING INSTRUCTIONS FOR ORTHOTICS  Don't expect to be comfortable wearing your orthotic devices for the first time.  Like eyeglasses, you may be aware of them as time passes, they will not be uncomfortable and you will enjoy wearing them.  FOLLOW THESE INSTRUCTIONS EXACTLY!  Wear your orthotic devices for:       Not more than 1 hour the first day.       Not more than 2 hours the second day.       Not more than 3 hours the third day and so on.        Or wear them for as long as they feel comfortable.       If you experience discomfort in your feet or legs take them out.  When feet & legs feel       better, put them back in.  You do need to be consistent and wear them a little        everyday. 2.   If at any time the orthotic devices become acutely uncomfortable before the       time for that particular day, STOP WEARING THEM. 3.   On the next day, do not increase the wearing time. 4.   Subsequently, increase the wearing time by 15-30 minutes only if comfortable to do       so. 5.   You will be seen by your doctor about 2-4 weeks after you receive your orthotic       devices, at which time you will probably be wearing your devices comfortably        for about 8 hours or more a day. 6.   Some patients occasionally report mild aches or discomfort in other parts of the of       body such as the knees, hips or back after 3 or 4 consecutive hours of wear.  If this       is the case with you, do not extend your wearing time.  Instead, cut it back an hour or       two.  In all likelihood, these symptoms will disappear in a short period of time as your       body posture realigns itself and functions more efficiently. 7.   It is possible that your orthotic device may require some small  changes or adjustment       to improve their function or make them more comfortable.   This is usually not done       before one to three months have elapsed.  These adjustments are made in        accordance with the changed position your feet are assuming as a result of       improved biomechanical function. 8.   In women's shoes, it's not unusual for your heel to slip out of the shoe, particularly if       they are step-in-shoes.  If this is the case, try other shoes or other styles.  Try to       purchase shoes which have deeper heal seats or higher heel counters. 9.   Squeaking of orthotics devices in the shoes is due to the movement of the devices  when they are functioning normally.  To eliminate squeaking, simply dust some       baby powder into your shoes before inserting the devices.  If this does not work,        apply soap or wax to the edges of the orthotic devices or put a tissue into the shoes. 10. It is important that you follow these directions explicitly.  Failure to do so will simply       prolong the adjustment period or create problems which are easily avoided.  It makes       no difference if you are wearing your orthotic devices for only a few hours after        several months, so long as you are wearing them comfortably for those hours. 11. If you have any questions or complaints, contact our office.  We have no way of       knowing about your problems unless you tell us.  If we do not hear from you, we will       assume that you are proceeding well.

## 2022-06-30 NOTE — Progress Notes (Signed)
Subjective:   Patient ID: Lisa [name], female   DOB: 57 y.o.   MRN: [id number]   HPI Chief Complaint  Patient presents with   capsulitis    Patient states foot pain is about the same since the last visit and states last injection only help for about 2-4 days.      57 year old female presents the office today with above concerns.  States that she still having pain in the left foot.  Last injection did help only lasted for couple of days.  No recent injury or changes otherwise.       Objective:  Physical Exam  General: AAO x3, NAD  Dermatological: Skin is warm, dry and supple bilateral.  There are no open sores, no preulcerative lesions, no rash or signs of infection present.  Vascular: Dorsalis Pedis artery and Posterior Tibial artery pedal pulses are 2/4 bilateral with immedate capillary fill time.There is no pain with calf compression, swelling, warmth, erythema.   Neruologic: Grossly intact via light touch bilateral  Musculoskeletal: Majority tenderness is localized along third interspace of the left foot.  There is also localized along the second interspace and submetatarsal 3.  There is no.  Pinpoint tenderness.  Today there is a small clicking sensation present along the third interspace consistent with a neuroma. Flexor, extensor tendons appear to be intact.  Muscular strength 5/5 in all groups tested bilateral.  Gait: Unassisted, Nonantalgic.      Assessment:   Inflammatory capsulitis of the third MPJ left and into the third interspace left     Plan:     -We discussed both conservative as well as surgical treatment options. -Her ongoing nature of her symptoms and negative x-ray ordered diagnostic ultrasound to evaluate for the neuroma.  We discussed the modifications, good arch support/orthotics.  Break-in instructions were discussed with the patient.  Prescribed a Medrol Dosepak.  Offloading.  No follow-ups on file.  Vivi Barrack DPM

## 2022-07-13 ENCOUNTER — Ambulatory Visit
Admission: RE | Admit: 2022-07-13 | Discharge: 2022-07-13 | Disposition: A | Discharge status: Home / Self Care | Payer: Medicare HMO | Source: Ambulatory Visit | Attending: Podiatry | Admitting: Podiatry

## 2022-07-13 DIAGNOSIS — D3610 Benign neoplasm of peripheral nerves and autonomic nervous system, unspecified: Secondary | ICD-10-CM

## 2022-07-21 ENCOUNTER — Other Ambulatory Visit: Payer: Self-pay | Admitting: Podiatry

## 2022-07-21 MED ORDER — CELECOXIB 100 MG PO CAPS: 100.0000 mg | ORAL_CAPSULE | Freq: Two times a day (BID) | ORAL | 1 refills | Status: AC

## 2022-07-25 ENCOUNTER — Telehealth: Payer: Self-pay | Admitting: Podiatry

## 2022-07-25 NOTE — Telephone Encounter (Signed)
Called pt to get scheduled for a 4 wk follow up and she asked to call back once she looks at her husbands schedule.

## 2022-07-25 NOTE — Telephone Encounter (Signed)
-----   Message from Vivi Barrack, DPM sent at 07/21/2022  2:15 PM EDT ----- Called to go over results. The steroids helped but the pain came back. Inserts help. Refilled Celebrex as that helped before and she tolerated it well.   Dawn, could you schedule her a follow up for about 4 weeks to see how she is doing?

## 2022-10-05 ENCOUNTER — Other Ambulatory Visit: Payer: Self-pay | Admitting: Podiatry

## 2022-10-05 ENCOUNTER — Telehealth: Payer: Self-pay | Admitting: Podiatry

## 2022-10-05 MED ORDER — DICLOFENAC SODIUM 1 % EX GEL: 2.0000 g | Freq: Four times a day (QID) | CUTANEOUS | 2 refills | Status: DC

## 2022-10-05 NOTE — Telephone Encounter (Signed)
Pt requested a Rx cream of her hammertoe. Please advise

## 2023-09-24 ENCOUNTER — Ambulatory Visit (INDEPENDENT_AMBULATORY_CARE_PROVIDER_SITE_OTHER)

## 2023-09-24 ENCOUNTER — Ambulatory Visit (INDEPENDENT_AMBULATORY_CARE_PROVIDER_SITE_OTHER): Payer: Self-pay | Admitting: Podiatry

## 2023-09-24 DIAGNOSIS — G5762 Lesion of plantar nerve, left lower limb: Secondary | ICD-10-CM | POA: Diagnosis not present

## 2023-09-24 DIAGNOSIS — M7752 Other enthesopathy of left foot: Secondary | ICD-10-CM

## 2023-09-24 DIAGNOSIS — M722 Plantar fascial fibromatosis: Secondary | ICD-10-CM

## 2023-09-24 DIAGNOSIS — M7751 Other enthesopathy of right foot: Secondary | ICD-10-CM | POA: Diagnosis not present

## 2023-09-24 MED ORDER — METHYLPREDNISOLONE 4 MG PO TBPK: ORAL_TABLET | ORAL | 0 refills | Status: AC

## 2023-09-24 MED ORDER — TRIAMCINOLONE ACETONIDE 10 MG/ML IJ SUSP
5.0000 mg | Freq: Once | INTRAMUSCULAR | Status: AC
Start: 2023-09-24 — End: 2023-09-24
  Administered 2023-09-24: 5 mg via INTRAMUSCULAR

## 2023-09-24 MED ORDER — CELECOXIB 100 MG PO CAPS: 100.0000 mg | ORAL_CAPSULE | Freq: Two times a day (BID) | ORAL | 0 refills | Status: DC

## 2023-09-24 NOTE — Patient Instructions (Signed)
 Start with the medrol  dose pack. Once complete start the Celebrex . Do not take together.   For instructions on how to put on your Night Splint, please visit [email or web address]   Plantar Fasciitis (Heel Spur Syndrome) with Rehab The plantar fascia is a fibrous, ligament-like, soft-tissue structure that spans the bottom of the foot. Plantar fasciitis is a condition that causes pain in the foot due to inflammation of the tissue. SYMPTOMS  Pain and tenderness on the underneath side of the foot. Pain that worsens with standing or walking. CAUSES  Plantar fasciitis is caused by irritation and injury to the plantar fascia on the underneath side of the foot. Common mechanisms of injury include: Direct trauma to bottom of the foot. Damage to a small nerve that runs under the foot where the main fascia attaches to the heel bone. Stress placed on the plantar fascia due to bone spurs. RISK INCREASES WITH:  Activities that place stress on the plantar fascia (running, jumping, pivoting, or cutting). Poor strength and flexibility. Improperly fitted shoes. Tight calf muscles. Flat feet. Failure to warm-up properly before activity. Obesity. PREVENTION Warm up and stretch properly before activity. Allow for adequate recovery between workouts. Maintain physical fitness: Strength, flexibility, and endurance. Cardiovascular fitness. Maintain a health body weight. Avoid stress on the plantar fascia. Wear properly fitted shoes, including arch supports for individuals who have flat feet.  PROGNOSIS  If treated properly, then the symptoms of plantar fasciitis usually resolve without surgery. However, occasionally surgery is necessary.  RELATED COMPLICATIONS  Recurrent symptoms that may result in a chronic condition. Problems of the lower back that are caused by compensating for the injury, such as limping. Pain or weakness of the foot during push-off following surgery. Chronic inflammation,  scarring, and partial or complete fascia tear, occurring more often from repeated injections.  TREATMENT  Treatment initially involves the use of ice and medication to help reduce pain and inflammation. The use of strengthening and stretching exercises may help reduce pain with activity, especially stretches of the Achilles tendon. These exercises may be performed at home or with a therapist. Your caregiver may recommend that you use heel cups of arch supports to help reduce stress on the plantar fascia. Occasionally, corticosteroid injections are given to reduce inflammation. If symptoms persist for greater than 6 months despite non-surgical (conservative), then surgery may be recommended.   MEDICATION  If pain medication is necessary, then nonsteroidal anti-inflammatory medications, such as aspirin and ibuprofen, or other minor pain relievers, such as acetaminophen , are often recommended. Do not take pain medication within 7 days before surgery. Prescription pain relievers may be given if deemed necessary by your caregiver. Use only as directed and only as much as you need. Corticosteroid injections may be given by your caregiver. These injections should be reserved for the most serious cases, because they may only be given a certain number of times.  HEAT AND COLD Cold treatment (icing) relieves pain and reduces inflammation. Cold treatment should be applied for 10 to 15 minutes every 2 to 3 hours for inflammation and pain and immediately after any activity that aggravates your symptoms. Use ice packs or massage the area with a piece of ice (ice massage). Heat treatment may be used prior to performing the stretching and strengthening activities prescribed by your caregiver, physical therapist, or athletic trainer. Use a heat pack or soak the injury in warm water.  SEEK IMMEDIATE MEDICAL CARE IF: Treatment seems to offer no benefit, or the condition worsens. Any medications  produce adverse side  effects.  EXERCISES- RANGE OF MOTION (ROM) AND STRETCHING EXERCISES - Plantar Fasciitis (Heel Spur Syndrome) These exercises may help you when beginning to rehabilitate your injury. Your symptoms may resolve with or without further involvement from your physician, physical therapist or athletic trainer. While completing these exercises, remember:  Restoring tissue flexibility helps normal motion to return to the joints. This allows healthier, less painful movement and activity. An effective stretch should be held for at least 30 seconds. A stretch should never be painful. You should only feel a gentle lengthening or release in the stretched tissue.  RANGE OF MOTION - Toe Extension, Flexion Sit with your right / left leg crossed over your opposite knee. Grasp your toes and gently pull them back toward the top of your foot. You should feel a stretch on the bottom of your toes and/or foot. Hold this stretch for 10 seconds. Now, gently pull your toes toward the bottom of your foot. You should feel a stretch on the top of your toes and or foot. Hold this stretch for 10 seconds. Repeat  times. Complete this stretch 3 times per day.   RANGE OF MOTION - Ankle Dorsiflexion, Active Assisted Remove shoes and sit on a chair that is preferably not on a carpeted surface. Place right / left foot under knee. Extend your opposite leg for support. Keeping your heel down, slide your right / left foot back toward the chair until you feel a stretch at your ankle or calf. If you do not feel a stretch, slide your bottom forward to the edge of the chair, while still keeping your heel down. Hold this stretch for 10 seconds. Repeat 3 times. Complete this stretch 2 times per day.   STRETCH  Gastroc, Standing Place hands on wall. Extend right / left leg, keeping the front knee somewhat bent. Slightly point your toes inward on your back foot. Keeping your right / left heel on the floor and your knee straight, shift  your weight toward the wall, not allowing your back to arch. You should feel a gentle stretch in the right / left calf. Hold this position for 10 seconds. Repeat 3 times. Complete this stretch 2 times per day.  STRETCH  Soleus, Standing Place hands on wall. Extend right / left leg, keeping the other knee somewhat bent. Slightly point your toes inward on your back foot. Keep your right / left heel on the floor, bend your back knee, and slightly shift your weight over the back leg so that you feel a gentle stretch deep in your back calf. Hold this position for 10 seconds. Repeat 3 times. Complete this stretch 2 times per day.  STRETCH  Gastrocsoleus, Standing  Note: This exercise can place a lot of stress on your foot and ankle. Please complete this exercise only if specifically instructed by your caregiver.  Place the ball of your right / left foot on a step, keeping your other foot firmly on the same step. Hold on to the wall or a rail for balance. Slowly lift your other foot, allowing your body weight to press your heel down over the edge of the step. You should feel a stretch in your right / left calf. Hold this position for 10 seconds. Repeat this exercise with a slight bend in your right / left knee. Repeat 3 times. Complete this stretch 2 times per day.   STRENGTHENING EXERCISES - Plantar Fasciitis (Heel Spur Syndrome)  These exercises may help you  when beginning to rehabilitate your injury. They may resolve your symptoms with or without further involvement from your physician, physical therapist or athletic trainer. While completing these exercises, remember:  Muscles can gain both the endurance and the strength needed for everyday activities through controlled exercises. Complete these exercises as instructed by your physician, physical therapist or athletic trainer. Progress the resistance and repetitions only as guided.  STRENGTH - Towel Curls Sit in a chair positioned on a  non-carpeted surface. Place your foot on a towel, keeping your heel on the floor. Pull the towel toward your heel by only curling your toes. Keep your heel on the floor. Repeat 3 times. Complete this exercise 2 times per day.  STRENGTH - Ankle Inversion Secure one end of a rubber exercise band/tubing to a fixed object (table, pole). Loop the other end around your foot just before your toes. Place your fists between your knees. This will focus your strengthening at your ankle. Slowly, pull your big toe up and in, making sure the band/tubing is positioned to resist the entire motion. Hold this position for 10 seconds. Have your muscles resist the band/tubing as it slowly pulls your foot back to the starting position. Repeat 3 times. Complete this exercises 2 times per day.  Document Released: 02/13/2005 Document Revised: 05/08/2011 Document Reviewed: 05/28/2008 [facility] Patient Information 2014 [place], [place].

## 2023-09-24 NOTE — Progress Notes (Signed)
 Subjective:  Patient ID: Lisa [name], female    DOB: 09/30/1965,  MRN: [id number]  Chief Complaint  Patient presents with   Foot Pain    Right heel pain hard to put pressure on it  Left foot toe pain     Discussed the use of AI scribe software for clinical note transcription with the patient, who gave verbal consent to proceed.  History of Present Illness Lisa [name] is a 58 year old female who presents with right heel and left foot pain.  She experiences right heel pain for two months, most severe in the afternoon and upon rising after rest, necessitating walking on tiptoes. Pain radiates to the lower back when pressure is applied to the heel, described as sharp and nerve-like. No swelling is present.  Left foot pain is located in the on top of the toes. She has a history of arthritis and a hammer toe in the left foot. A previous MRI showed no significant findings. She recalls using a cream that provided some relief.   She is allergic to ibuprofen but has tolerated Celebrex  in the past.      Objective:    Physical Exam General: AAO x3, NAD  Dermatological: Skin is warm, dry and supple bilateral.  There are no open sores, no preulcerative lesions, no rash or signs of infection present.  Vascular: Dorsalis Pedis artery and Posterior Tibial artery pedal pulses are 2/4 bilateral with immedate capillary fill time.  There is no pain with calf compression, swelling, warmth, erythema.   Neruologic: Grossly intact via light touch bilateral.   Musculoskeletal: On the right foot there is tenderness palpation plantar aspect the calcaneus along insertion of plantar fascia as well as the arch of the foot.  There is no area pinpoint tenderness.  There is no pain with lateral compression of calcaneus.  No edema no erythema.  She does still get discomfort on metatarsal heads on the left foot diffusely as well as along the second interspace.  There is no area pinpoint  tenderness.  Gait: Unassisted, Nonantalgic.     No images are attached to the encounter.    Results    Assessment:   1. Capsulitis of metatarsophalangeal (MTP) joint of left foot   2. Morton neuroma, left   3. Plantar fasciitis, right      Plan:  Patient was evaluated and treated and all questions answered.  Assessment and Plan Assessment & Plan Plantar fasciitis, right foot Chronic plantar fasciitis with heel pain and possible nerve involvement. Flat foot structure may contribute. - Prescribed Medrol  Dosepak for one week. - Initiate Celebrex  post-steroid course. - Administer steroid injection to right heel.  See procedure note below. - Provided exercise worksheet for calf, Achilles, and plantar fascia stretching. - Recommended night splint use. - Advised daily icing and rolling frozen water bottle under arch. - Emphasized wearing shoes with good arch support. - Consider nerve conduction test if symptoms persist.  Procedure: Injection Tendon/Ligament Discussed alternatives, risks, complications and verbal consent was obtained.  Location: Right plantar fascia at the glabrous junction; medial approach. Skin Prep: Alcohol. Injectate: 0.5cc 0.5% marcaine  plain, 0.5 cc 2% lidocaine  plain and, 1 cc kenalog  10. Disposition: Patient tolerated procedure well. Injection site dressed with a band-aid.  Post-injection care was discussed and return precautions discussed.    Arthritis, left foot Mild arthritis with persistent symptoms despite conservative management. - Order MRI with contrast for further assessment for greater than 1 year without improvement.  Return for MRI results.   Donnice JONELLE Fees DPM

## 2023-09-29 ENCOUNTER — Ambulatory Visit
Admission: RE | Admit: 2023-09-29 | Discharge: 2023-09-29 | Disposition: A | Discharge status: Home / Self Care | Source: Ambulatory Visit | Attending: Podiatry | Admitting: Podiatry

## 2023-09-29 ENCOUNTER — Other Ambulatory Visit: Payer: Self-pay | Admitting: Podiatry

## 2023-09-29 DIAGNOSIS — M7752 Other enthesopathy of left foot: Secondary | ICD-10-CM

## 2023-09-29 DIAGNOSIS — M722 Plantar fascial fibromatosis: Secondary | ICD-10-CM

## 2023-09-29 DIAGNOSIS — G5762 Lesion of plantar nerve, left lower limb: Secondary | ICD-10-CM

## 2023-10-01 ENCOUNTER — Ambulatory Visit: Payer: Self-pay | Admitting: Podiatry

## 2023-10-01 ENCOUNTER — Other Ambulatory Visit: Payer: Self-pay | Admitting: Podiatry

## 2023-10-01 ENCOUNTER — Telehealth: Payer: Self-pay | Admitting: Podiatry

## 2023-10-01 MED ORDER — DICLOFENAC SODIUM 1 % EX GEL
2.0000 g | Freq: Four times a day (QID) | CUTANEOUS | 2 refills | Status: AC
Start: 2023-10-01 — End: ?

## 2023-10-01 NOTE — Telephone Encounter (Signed)
 Patient called inquiring about refill for diclofenac  Sodium (VOLTAREN ) 1 % GEL, says she asked for refill at last appointment. If it's ok to send patient would like it sent to [facility] on [address], Thanks!

## 2023-10-08 ENCOUNTER — Other Ambulatory Visit

## 2023-10-10 NOTE — Progress Notes (Signed)
 Spoke to patient informed and discussed recommendations.

## 2023-11-08 ENCOUNTER — Other Ambulatory Visit: Payer: Self-pay | Admitting: Podiatry

## 2023-11-30 ENCOUNTER — Other Ambulatory Visit: Payer: Self-pay

## 2023-11-30 ENCOUNTER — Ambulatory Visit
Admission: EM | Admit: 2023-11-30 | Discharge: 2023-11-30 | Disposition: A | Discharge status: Home / Self Care | Attending: Family Medicine | Admitting: Family Medicine

## 2023-11-30 DIAGNOSIS — R197 Diarrhea, unspecified: Secondary | ICD-10-CM | POA: Diagnosis not present

## 2023-11-30 DIAGNOSIS — U071 COVID-19: Secondary | ICD-10-CM

## 2023-11-30 LAB — POC SOFIA SARS ANTIGEN FIA: SARS Coronavirus 2 Ag: POSITIVE — AB

## 2023-11-30 MED ORDER — MOLNUPIRAVIR 200 MG PO CAPS
4.0000 | ORAL_CAPSULE | Freq: Two times a day (BID) | ORAL | 0 refills | Status: AC
Start: 2023-11-30 — End: 2023-12-05

## 2023-11-30 NOTE — ED Provider Notes (Addendum)
 [facility]    CSN: [id number] Arrival date & time: 11/30/23  1006      History   Chief Complaint Chief Complaint  Patient presents with   Chills   Fatigue        Diarrhea    HPI Lisa [name] is a 58 y.o. female presents for diarrhea.  Patient reports 5 days of nonbloody nonbilious soft frequent stools with abdominal cramping.  Denies liquid stools. she also reports chills, headache, subjective fever and loss of appetite.  And 1 episode of vomiting.  No cough, congestion, sore throat, body aches, shortness of breath.  She is currently on cefdinir for sinusitis as well as a Medrol  Dosepak.  She states today the last day of the steroid pack and she has 2 more days in the cefdinir.  Reports her sinus symptoms have not improved nor worsened.  No history of GI diagnoses such as Crohn's, IBS, colitis, diverticulitis.  No recent travel.  She is able to stay hydrated and denies dysuria.  No other concerns at this time  HPI  Past Medical History:  Diagnosis Date   Allergic rhinitis, cause unspecified    Anemia 08-18-11   iron deficiency.   Blood transfusion    Depression    Hyperlipidemia    Neuromuscular disorder (HCC) 08-18-11   left  shoulder chronic pain,remains being evaluated-Dr. Bonner   Other chronic allergic conjunctivitis    Transfusion history 08-18-11   x5 yrs ago-low blood count    Patient Active Problem List   Diagnosis Date Noted   Leg pain 09/23/2019   OSA (obstructive sleep apnea) 11/24/2014   Umbilical hernia 07/11/2011    Past Surgical History:  Procedure Laterality Date   ABDOMINAL HYSTERECTOMY  08/18/2011   Vaginal Hysterectomy   BLADDER SURGERY     HAND SURGERY  08/18/2011   2009-Bil. reains with some pain and numbness(wrist and palm)   NECK SURGERY  08/18/2011   2009-Dr. Burnetta- Fusion cervical with retained hardware.ROM causes some pain still.   SHOULDER SURGERY  02/27/2005   both   UMBILICAL HERNIA REPAIR  08/25/2011   Procedure:  HERNIA REPAIR UMBILICAL ADULT;  Surgeon: Vicenta DELENA Poli, MD;  Location: [facility];  Service: General;  Laterality: N/A;    OB History   No obstetric history on file.      Home Medications    Prior to Admission medications   Medication Sig Start Date End Date Taking? Authorizing Provider  celecoxib  (CELEBREX ) 100 MG capsule Take 1 capsule (100 mg total) by mouth 2 (two) times daily. 07/21/22   Gershon Donnice SAUNDERS, DPM  molnupiravir EUA (LAGEVRIO) 200 MG CAPS capsule Take 4 capsules (800 mg total) by mouth 2 (two) times daily for 5 days. 11/30/23 12/05/23 Yes Triniti Gruetzmacher, Jodi R, NP  ARIPiprazole (ABILIFY) 5 MG tablet Take 5 mg by mouth daily.    [provider]  celecoxib  (CELEBREX ) 100 MG capsule TAKE 1 CAPSULE BY MOUTH TWICE A DAY 08/12/21   Gershon Donnice SAUNDERS, DPM  celecoxib  (CELEBREX ) 100 MG capsule TAKE 1 CAPSULE(100 MG) BY MOUTH TWICE DAILY 11/08/23   Gershon Donnice SAUNDERS, DPM  cephALEXin  (KEFLEX ) 500 MG capsule Take 1 capsule (500 mg total) by mouth 4 (four) times daily. Patient not taking: Reported on 09/16/2020 09/17/19   Joldersma, Logan, PA-C  clotrimazole -betamethasone  (LOTRISONE ) cream Apply 1 application topically 2 (two) times daily. 02/09/20   Gershon Donnice SAUNDERS, DPM  diazepam  (VALIUM ) 5 MG tablet Take 1 tablet (5 mg total) by  mouth 2 (two) times daily. Patient not taking: Reported on 12/06/2015 07/12/13   Garrick Charleston, MD  diclofenac  Sodium (VOLTAREN ) 1 % GEL Apply 2 g topically 4 (four) times daily. Rub into affected area of foot 2 to 4 times daily 10/01/23   Gershon Donnice SAUNDERS, DPM  doxepin (SINEQUAN) 75 MG capsule Take 75 mg by mouth at bedtime. 03/22/20   [provider]  DULoxetine (CYMBALTA) 60 MG capsule Take 120 mg by mouth at bedtime.     [provider]  estradiol (ESTRACE) 1 MG tablet estradiol 1 mg tablet    [provider]  gabapentin (NEURONTIN) 300 MG capsule Take 300 mg by mouth 3 (three) times daily.    [provider]   HYDROcodone -acetaminophen  (NORCO/VICODIN) 5-325 MG tablet Take 1 tablet by mouth every 6 (six) hours as needed for moderate pain. 12/07/15   Earnesteen Harlene CROME, PA  methylPREDNISolone  (MEDROL  DOSEPAK) 4 MG TBPK tablet Take as directed 06/27/22   Gershon Donnice SAUNDERS, DPM  methylPREDNISolone  (MEDROL  DOSEPAK) 4 MG TBPK tablet Take as directed 09/24/23   Gershon Donnice SAUNDERS, DPM  oxybutynin (DITROPAN) 5 MG tablet Take 5 mg by mouth 3 (three) times daily. 05/23/20   [provider]  oxyCODONE -acetaminophen  (PERCOCET) 10-325 MG tablet Take 1 tablet by mouth 4 (four) times daily as needed.    [provider]  TOVIAZ 4 MG TB24 tablet Take 4 mg by mouth daily. Patient not taking: Reported on 09/16/2020 09/06/20   [provider]    Family History Family History  Problem Relation Age of Onset   Cancer Maternal Grandfather        unknown    Social History Social History   Tobacco Use   Smoking status: Never   Smokeless tobacco: Never  Vaping Use   Vaping status: Never Used  Substance Use Topics   Alcohol use: No   Drug use: No     Allergies   Ibuprofen   Review of Systems Review of Systems  Constitutional:  Positive for appetite change and chills.  Gastrointestinal:  Positive for diarrhea.  Neurological:  Positive for headaches.     Physical Exam Triage Vital Signs ED Triage Vitals  Encounter Vitals Group     BP 11/30/23 1025 102/70     Girls Systolic BP Percentile --      Girls Diastolic BP Percentile --      Boys Systolic BP Percentile --      Boys Diastolic BP Percentile --      Pulse Rate 11/30/23 1025 71     Resp 11/30/23 1025 17     Temp 11/30/23 1025 98.7 F (37.1 C)     Temp Source 11/30/23 1025 Oral     SpO2 11/30/23 1025 95 %     Weight --      Height --      Head Circumference --      Peak Flow --      Pain Score 11/30/23 1021 8     Pain Loc --      Pain Education --      Exclude from Growth Chart --    No data found.  Updated  Vital Signs BP 102/70   Pulse 71   Temp 98.7 F (37.1 C) (Oral)   Resp 17   SpO2 95%   Visual Acuity Right Eye Distance:   Left Eye Distance:   Bilateral Distance:    Right Eye Near:   Left Eye Near:  Bilateral Near:     Physical Exam Vitals and nursing note reviewed.  Constitutional:      General: She is not in acute distress.    Appearance: Normal appearance. She is not ill-appearing, toxic-appearing or diaphoretic.  HENT:     Head: Normocephalic and atraumatic.  Eyes:     Pupils: Pupils are equal, round, and reactive to light.  Cardiovascular:     Rate and Rhythm: Normal rate and regular rhythm.     Heart sounds: Normal heart sounds.  Pulmonary:     Effort: Pulmonary effort is normal.     Breath sounds: Normal breath sounds.  Abdominal:     General: Bowel sounds are normal.     Palpations: Abdomen is soft.     Tenderness: There is generalized abdominal tenderness. There is no guarding or rebound. Negative signs include Rovsing's sign and McBurney's sign.  Skin:    General: Skin is warm and dry.  Neurological:     General: No focal deficit present.     Mental Status: She is alert and oriented to person, place, and time.  Psychiatric:        Mood and Affect: Mood normal.        Behavior: Behavior normal.      UC Treatments / Results  Labs (all labs ordered are listed, but only abnormal results are displayed) Labs Reviewed  POC SOFIA SARS ANTIGEN FIA - Abnormal; Notable for the following components:      Result Value   SARS Coronavirus 2 Ag Positive (*)    All other components within normal limits    EKG   Radiology No results found.  Procedures Procedures (including critical care time)  Medications Ordered in UC Medications - No data to display  Initial Impression / Assessment and Plan / UC Course  I have reviewed the triage vital signs and the nursing notes.  Pertinent labs & imaging results that were available during my care of the patient  were reviewed by me and considered in my medical decision making (see chart for details).     Reviewed exam and symptoms with patient.  Positive COVID-19.  Reviewed with patient.  Today is day 5 so she is still in the window for antiviral therapy.  She has had COVID in the past without complication or hospitalization.  She does qualify for antiviral therapy and is in agreement to start molnupiravir, side effect profile reviewed.  Discussed rest/fluids and electrolyte replacement.  She message over-the-counter probiotics as well.  Advised PCP follow-up if symptoms do not improve..  Strict ER precautions reviewed and patient verbalized understanding Final Clinical Impressions(s) / UC Diagnoses   Final diagnoses:  Diarrhea, unspecified type  COVID-19     Discharge Instructions      You tested positive for COVID-19.  You may take molnupiravir twice daily for 5 days.  This is an antiviral medication that is used to reduce severity of symptoms and chance of complication from the virus.  It does not make the virus go away.  Focus on hydration/electrolyte replacement with Gatorade, Powerade, Pedialyte, water.  Lots of rest.  You may take an over-the-counter probiotic as well.  Please follow-up with your PCP if your symptoms do not improve.  Please go to the ER for any worsening symptoms.  I hope you feel better soon!     ED Prescriptions     Medication Sig Dispense Auth. Provider   molnupiravir EUA (LAGEVRIO) 200 MG CAPS capsule Take 4 capsules (800  mg total) by mouth 2 (two) times daily for 5 days. 40 capsule Cloee Dunwoody, Jodi R, NP      PDMP not reviewed this encounter.   Loreda Myla SAUNDERS, NP 11/30/23 1113    Loreda Myla SAUNDERS, NP 11/30/23 1113

## 2023-11-30 NOTE — Discharge Instructions (Addendum)
 You tested positive for COVID-19.  You may take molnupiravir twice daily for 5 days.  This is an antiviral medication that is used to reduce severity of symptoms and chance of complication from the virus.  It does not make the virus go away.  Focus on hydration/electrolyte replacement with Gatorade, Powerade, Pedialyte, water.  Lots of rest.  You may take an over-the-counter probiotic as well.  Please follow-up with your PCP if your symptoms do not improve.  Please go to the ER for any worsening symptoms.  I hope you feel better soon!

## 2023-11-30 NOTE — ED Triage Notes (Signed)
 Pt c/o chills, HA,  feels hot, diarrhea, loss of appetitex5d.

## 2023-12-21 ENCOUNTER — Ambulatory Visit
Admission: EM | Admit: 2023-12-21 | Discharge: 2023-12-21 | Disposition: A | Discharge status: Home / Self Care | Attending: Family Medicine | Admitting: Family Medicine

## 2023-12-21 DIAGNOSIS — H61002 Unspecified perichondritis of left external ear: Secondary | ICD-10-CM | POA: Diagnosis not present

## 2023-12-21 MED ORDER — CIPROFLOXACIN HCL 500 MG PO TABS: 500.0000 mg | ORAL_TABLET | Freq: Two times a day (BID) | ORAL | 0 refills | Status: AC

## 2023-12-21 MED ORDER — FLUCONAZOLE 150 MG PO TABS: 150.0000 mg | ORAL_TABLET | ORAL | 0 refills | Status: AC

## 2023-12-21 NOTE — ED Provider Notes (Signed)
 [name]  Note:  This document was prepared using Conservation officer, historic buildings and may include unintentional dictation errors.  MRN: [id number] DOB: 1965/04/09  Subjective:   Lisa [name] is a 58 y.o. female presenting for 3-day history of persistent severe left ear pain with swelling.  Pain radiates to the back of her ear as well.  No fever, runny or stuffy nose, ear drainage, tinnitus, dizziness, wounds.  No rashes.  She has Percocet at home.  No diabetes or immunocompromising conditions.  No current facility-administered medications for this encounter.  Current Outpatient Medications:    celecoxib  (CELEBREX ) 100 MG capsule, Take 1 capsule (100 mg total) by mouth 2 (two) times daily., Disp: 30 capsule, Rfl: 1   ARIPiprazole (ABILIFY) 5 MG tablet, Take 5 mg by mouth daily., Disp: , Rfl:    celecoxib  (CELEBREX ) 100 MG capsule, TAKE 1 CAPSULE BY MOUTH TWICE A DAY, Disp: 30 capsule, Rfl: 0   celecoxib  (CELEBREX ) 100 MG capsule, TAKE 1 CAPSULE(100 MG) BY MOUTH TWICE DAILY, Disp: 30 capsule, Rfl: 0   cephALEXin  (KEFLEX ) 500 MG capsule, Take 1 capsule (500 mg total) by mouth 4 (four) times daily. (Patient not taking: Reported on 09/16/2020), Disp: 20 capsule, Rfl: 0   clotrimazole -betamethasone  (LOTRISONE ) cream, Apply 1 application topically 2 (two) times daily., Disp: 30 g, Rfl: 0   diazepam  (VALIUM ) 5 MG tablet, Take 1 tablet (5 mg total) by mouth 2 (two) times daily. (Patient not taking: Reported on 12/06/2015), Disp: 10 tablet, Rfl: 0   diclofenac  Sodium (VOLTAREN ) 1 % GEL, Apply 2 g topically 4 (four) times daily. Rub into affected area of foot 2 to 4 times daily, Disp: 100 g, Rfl: 2   doxepin (SINEQUAN) 75 MG capsule, Take 75 mg by mouth at bedtime., Disp: , Rfl:    DULoxetine (CYMBALTA) 60 MG capsule, Take 120 mg by mouth at bedtime. , Disp: , Rfl:    estradiol (ESTRACE) 1 MG tablet, estradiol 1 mg tablet, Disp: , Rfl:    gabapentin (NEURONTIN) 300 MG  capsule, Take 300 mg by mouth 3 (three) times daily., Disp: , Rfl:    HYDROcodone -acetaminophen  (NORCO/VICODIN) 5-325 MG tablet, Take 1 tablet by mouth every 6 (six) hours as needed for moderate pain., Disp: 10 tablet, Rfl: 0   methylPREDNISolone  (MEDROL  DOSEPAK) 4 MG TBPK tablet, Take as directed, Disp: 21 tablet, Rfl: 0   methylPREDNISolone  (MEDROL  DOSEPAK) 4 MG TBPK tablet, Take as directed, Disp: 21 tablet, Rfl: 0   oxybutynin (DITROPAN) 5 MG tablet, Take 5 mg by mouth 3 (three) times daily., Disp: , Rfl:    oxyCODONE -acetaminophen  (PERCOCET) 10-325 MG tablet, Take 1 tablet by mouth 4 (four) times daily as needed., Disp: , Rfl:    TOVIAZ 4 MG TB24 tablet, Take 4 mg by mouth daily. (Patient not taking: Reported on 09/16/2020), Disp: , Rfl:    Allergies  Allergen Reactions   Ibuprofen     Stomach upset.     Past Medical History:  Diagnosis Date   Allergic rhinitis, cause unspecified    Anemia 08-18-11   iron deficiency.   Blood transfusion    Depression    Hyperlipidemia    Neuromuscular disorder (HCC) 08-18-11   left  shoulder chronic pain,remains being evaluated-Dr. Bonner   Other chronic allergic conjunctivitis    Transfusion history 08-18-11   x5 yrs ago-low blood count     Past Surgical History:  Procedure Laterality Date   ABDOMINAL HYSTERECTOMY  08/18/2011  Vaginal Hysterectomy   BLADDER SURGERY     HAND SURGERY  08/18/2011   2009-Bil. reains with some pain and numbness(wrist and palm)   NECK SURGERY  08/18/2011   2009-Dr. Burnetta- Fusion cervical with retained hardware.ROM causes some pain still.   SHOULDER SURGERY  02/27/2005   both   UMBILICAL HERNIA REPAIR  08/25/2011   Procedure: HERNIA REPAIR UMBILICAL ADULT;  Surgeon: Vicenta DELENA Poli, MD;  Location: [facility];  Service: General;  Laterality: N/A;    Family History  Problem Relation Age of Onset   Cancer Maternal Grandfather        unknown    Social History   Tobacco Use   Smoking status: Never    Smokeless tobacco: Never  Vaping Use   Vaping status: Never Used  Substance Use Topics   Alcohol use: No   Drug use: No    ROS   Objective:   Vitals: BP 107/74   Pulse 73   Temp 98.1 F (36.7 C) (Oral)   Resp 18   SpO2 97%   Physical Exam Constitutional:      General: She is not in acute distress.    Appearance: Normal appearance. She is well-developed. She is not ill-appearing, toxic-appearing or diaphoretic.  HENT:     Head: Normocephalic and atraumatic.     Right Ear: Tympanic membrane, ear canal and external ear normal. No tenderness. There is no impacted cerumen. Tympanic membrane is not injected, perforated, erythematous or bulging.     Left Ear: Tympanic membrane normal. Swelling (trace - 1+ swelling over area outlined) and tenderness (of auricle as outlined) present. There is no impacted cerumen. Tympanic membrane is not injected, perforated, erythematous or bulging.     Ears:      Nose: Nose normal.     Mouth/Throat:     Mouth: Mucous membranes are moist.  Eyes:     General: No scleral icterus.       Right eye: No discharge.        Left eye: No discharge.     Extraocular Movements: Extraocular movements intact.  Cardiovascular:     Rate and Rhythm: Normal rate.  Pulmonary:     Effort: Pulmonary effort is normal.  Skin:    General: Skin is warm and dry.     Findings: No rash.  Neurological:     General: No focal deficit present.     Mental Status: She is alert and oriented to person, place, and time.  Psychiatric:        Mood and Affect: Mood normal.        Behavior: Behavior normal.     Assessment and Plan :   PDMP not reviewed this encounter.  1. Perichondritis of auricle, left     Creatinine clearance calculated at 8mL/min using creatinine level from June 2025.  As such we will cover for perichondritis with ciprofloxacin.  Counseled patient on potential for adverse effects with medications prescribed/recommended today, ER and return-to-clinic  precautions discussed, patient verbalized understanding.    Lisa [name], [place] 12/21/23 [id number]

## 2023-12-21 NOTE — ED Triage Notes (Signed)
 Pt present with c/o lt ear swelling and soreness x three days. Pt denies ear fullness.  Home interventions: percocet
